# Patient Record
Sex: Male | Born: 1990 | Race: Black or African American | Hispanic: No | Marital: Single | Smoking: Never smoker
Health system: Southern US, Community
[De-identification: ages and names within clinical notes are randomized; demographics above are authoritative.]

## PROBLEM LIST (undated history)

## (undated) DIAGNOSIS — E669 Obesity, unspecified: Secondary | ICD-10-CM

## (undated) DIAGNOSIS — K2210 Ulcer of esophagus without bleeding: Secondary | ICD-10-CM

## (undated) DIAGNOSIS — J45909 Unspecified asthma, uncomplicated: Secondary | ICD-10-CM

## (undated) DIAGNOSIS — K219 Gastro-esophageal reflux disease without esophagitis: Secondary | ICD-10-CM

## (undated) HISTORY — DX: Obesity, unspecified: E66.9

## (undated) HISTORY — DX: Ulcer of esophagus without bleeding: K22.10

## (undated) HISTORY — PX: SHOULDER SURGERY: SHX246

---
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
## 2012-01-31 ENCOUNTER — Encounter (HOSPITAL_COMMUNITY): Payer: Self-pay

## 2012-01-31 ENCOUNTER — Emergency Department (HOSPITAL_COMMUNITY): Payer: Medicaid Other

## 2012-01-31 ENCOUNTER — Emergency Department (HOSPITAL_COMMUNITY)
Admission: EM | Admit: 2012-01-31 | Discharge: 2012-01-31 | Disposition: A | Discharge status: Home / Self Care | Payer: Medicaid Other | Attending: Emergency Medicine | Admitting: Emergency Medicine

## 2012-01-31 DIAGNOSIS — R112 Nausea with vomiting, unspecified: Secondary | ICD-10-CM | POA: Insufficient documentation

## 2012-01-31 DIAGNOSIS — R1013 Epigastric pain: Secondary | ICD-10-CM | POA: Insufficient documentation

## 2012-01-31 DIAGNOSIS — K219 Gastro-esophageal reflux disease without esophagitis: Secondary | ICD-10-CM | POA: Insufficient documentation

## 2012-01-31 DIAGNOSIS — J45909 Unspecified asthma, uncomplicated: Secondary | ICD-10-CM | POA: Insufficient documentation

## 2012-01-31 DIAGNOSIS — Z79899 Other long term (current) drug therapy: Secondary | ICD-10-CM | POA: Insufficient documentation

## 2012-01-31 HISTORY — DX: Gastro-esophageal reflux disease without esophagitis: K21.9

## 2012-01-31 HISTORY — DX: Unspecified asthma, uncomplicated: J45.909

## 2012-01-31 LAB — COMPREHENSIVE METABOLIC PANEL
ALT: 36 U/L (ref 0–53)
AST: 27 U/L (ref 0–37)
Albumin: 3.5 g/dL (ref 3.5–5.2)
Chloride: 100 mEq/L (ref 96–112)
Creatinine, Ser: 0.93 mg/dL (ref 0.50–1.35)
Sodium: 137 mEq/L (ref 135–145)
Total Bilirubin: 0.3 mg/dL (ref 0.3–1.2)

## 2012-01-31 LAB — CBC
Hemoglobin: 15.2 g/dL (ref 13.0–17.0)
MCH: 27.5 pg (ref 26.0–34.0)
MCHC: 33.8 g/dL (ref 30.0–36.0)
MCV: 81.4 fL (ref 78.0–100.0)
RBC: 5.53 MIL/uL (ref 4.22–5.81)
RDW: 14.1 % (ref 11.5–15.5)

## 2012-01-31 LAB — URINALYSIS, ROUTINE W REFLEX MICROSCOPIC
Ketones, ur: NEGATIVE mg/dL
Leukocytes, UA: NEGATIVE
Nitrite: NEGATIVE
Specific Gravity, Urine: 1.026 (ref 1.005–1.030)
pH: 6.5 (ref 5.0–8.0)

## 2012-01-31 LAB — URINE MICROSCOPIC-ADD ON

## 2012-01-31 LAB — LIPASE, BLOOD: Lipase: 28 U/L (ref 11–59)

## 2012-01-31 MED ORDER — OXYCODONE-ACETAMINOPHEN 5-325 MG PO TABS: ORAL_TABLET | ORAL | Status: DC

## 2012-01-31 MED ORDER — PANTOPRAZOLE SODIUM 20 MG PO TBEC: 40.0000 mg | DELAYED_RELEASE_TABLET | Freq: Every day | ORAL | Status: DC

## 2012-01-31 MED ORDER — PANTOPRAZOLE SODIUM 40 MG IV SOLR
40.0000 mg | Freq: Once | INTRAVENOUS | Status: DC
  Filled 2012-01-31: qty 40

## 2012-01-31 MED ORDER — ONDANSETRON HCL 4 MG/2ML IJ SOLN
4.0000 mg | Freq: Once | INTRAMUSCULAR | Status: DC
  Filled 2012-01-31: qty 2

## 2012-01-31 MED ORDER — ONDANSETRON HCL 4 MG PO TABS: 4.0000 mg | ORAL_TABLET | Freq: Three times a day (TID) | ORAL | Status: DC | PRN

## 2012-01-31 MED ORDER — MORPHINE SULFATE 4 MG/ML IJ SOLN
6.0000 mg | Freq: Once | INTRAMUSCULAR | Status: DC
  Filled 2012-01-31 (×2): qty 1

## 2012-01-31 MED ORDER — SODIUM CHLORIDE 0.9 % IV BOLUS (SEPSIS): 1000.0000 mL | Freq: Once | INTRAVENOUS | Status: DC

## 2012-01-31 MED ORDER — GI COCKTAIL ~~LOC~~
30.0000 mL | Freq: Once | ORAL | Status: AC
  Administered 2012-01-31: 30 mL via ORAL
  Filled 2012-01-31: qty 30

## 2012-01-31 MED ORDER — PANTOPRAZOLE SODIUM 40 MG PO TBEC
40.0000 mg | DELAYED_RELEASE_TABLET | Freq: Once | ORAL | Status: AC
  Administered 2012-01-31: 40 mg via ORAL
  Filled 2012-01-31: qty 1

## 2012-01-31 NOTE — ED Notes (Signed)
US at bedside

## 2012-01-31 NOTE — ED Provider Notes (Signed)
Medical screening examination/treatment/procedure(s) were performed by non-physician practitioner and as supervising physician I was immediately available for consultation/collaboration.  Doug Sou, MD 01/31/12 [phone]

## 2012-01-31 NOTE — ED Provider Notes (Signed)
History     CSN: [id number]  Arrival date & time 01/31/12  1122   First MD Initiated Contact with Patient 01/31/12 1257      Chief Complaint  Patient presents with  . Abdominal Pain    (Consider location/radiation/quality/duration/timing/severity/associated sxs/prior treatment) HPI  Kevin Bauer is a 21 y.o. male complaining of epigastric pain onset this a.m. at 5 AM. This pain woke him from sleep it was extreme: 10 out of 10. He had single episode of nonbloody nonbilious non-coffee-ground emesis today. Since that time he is eaten for chest sausage is without issue. Patient cannot identify any exacerbating factors. He denies pain with movement or palpation. He states the pain comes in waves and is unpredictable. Pain is not exacerbated by food intake. He states it is slightly relieved with emesis. Patient has had similar episodes sometimes weekly sometimes daily for several months now. He was diagnosed with GERD and Protonix. Patient has not had any Protonix in 3 weeks because he is ran out of his prescription.  Past Medical History  Diagnosis Date  . GERD (gastroesophageal reflux disease)   . Asthma     History reviewed. No pertinent past surgical history.  No family history on file.  History  Substance Use Topics  . Smoking status: Never Smoker   . Smokeless tobacco: Not on file  . Alcohol Use: No      Review of Systems  Constitutional: Negative for fever.  Respiratory: Negative for shortness of breath.   Cardiovascular: Negative for chest pain.  Gastrointestinal: Positive for nausea, vomiting and abdominal pain. Negative for diarrhea.  All other systems reviewed and are negative.    Allergies  Review of patient's allergies indicates no known allergies.  Home Medications   Current Outpatient Rx  Name  Route  Sig  Dispense  Refill  . BISMUTH SUBSALICYLATE 262 MG/15ML PO SUSP   Oral   Take 15 mLs by mouth every 6 (six) hours as needed.         Marland Kitchen OMEPRAZOLE  20 MG PO CPDR   Oral   Take 1 capsule (20 mg total) by mouth daily.   30 capsule   1     BP 132/75  Pulse 87  Temp 98.4 F (36.9 C) (Oral)  Resp 18  Ht 5\' 11"  (1.803 m)  Wt 275 lb (124.739 kg)  BMI 38.35 kg/m2  SpO2 100%  Physical Exam  Nursing note and vitals reviewed. Constitutional: He is oriented to person, place, and time. He appears well-developed and well-nourished. No distress.  HENT:  Head: Normocephalic.  Mouth/Throat: Oropharynx is clear and moist.  Eyes: Conjunctivae normal and EOM are normal. Pupils are equal, round, and reactive to light.  Cardiovascular: Normal rate, regular rhythm and intact distal pulses.   Pulmonary/Chest: Effort normal and breath sounds normal. No stridor. No respiratory distress. He has no wheezes. He has no rales. He exhibits no tenderness.  Abdominal: Soft. Bowel sounds are normal. He exhibits no distension and no mass. There is no tenderness. There is no rebound and no guarding.       No tenderness to palpation or peritoneal signs.  Musculoskeletal: Normal range of motion.  Neurological: He is alert and oriented to person, place, and time.  Psychiatric: He has a normal mood and affect.    ED Course  Procedures (including critical care time)  Labs Reviewed  URINALYSIS, ROUTINE W REFLEX MICROSCOPIC - Abnormal; Notable for the following:    Protein, ur 100 (*)  All other components within normal limits  URINE MICROSCOPIC-ADD ON - Abnormal; Notable for the following:    Bacteria, UA FEW (*)     All other components within normal limits  COMPREHENSIVE METABOLIC PANEL  CBC  LIPASE, BLOOD   US Abdomen Complete  01/31/2012  Abdominal ultrasound  History:  Abdominal pain  Findings:  Gallbladder is visualized in multiple projections. There are no gallstones, gallbladder wall thickening, or pericholecystic fluid collection.  There is no intrahepatic, common hepatic, common bile duct dilatation.  Pancreas is obscured by gas.  There is  fatty change in the liver.  No focal liver lesions are identified.  Spleen is normal in size and homogeneous in echotexture.  Flank kidneys appear normal bilaterally.  There is no ascites.  Aorta is nonaneurysmal.  Inferior vena cava appears normal.  Conclusion:  Fatty liver.  While no focal liver lesions are identified, it must be cautioned that the sensitivity of ultrasound for more subtle liver lesions is diminished given underlying fatty change.  Pancreas is obscured by gas.  Study is otherwise unremarkable.   Original Report Authenticated By: Bretta Bang, M.D.      1. Epigastric pain       MDM   Serial abdominal exams remained benign. Patient is tolerating by mouth at this time. Blood work and urinalysis are unremarkable. It is likely related to patient running out of his Protonix and he also reports she's been taking Motrin for pain control. I have advised him not to take any NSAIDs for pain control.  Abdominal ultrasound shows no acute abnormality.   Pt verbalized understanding and agrees with care plan. Outpatient follow-up and return precautions given.    New Prescriptions   ONDANSETRON (ZOFRAN) 4 MG TABLET    Take 1 tablet (4 mg total) by mouth every 8 (eight) hours as needed for nausea.   OXYCODONE-ACETAMINOPHEN (PERCOCET/ROXICET) 5-325 MG PER TABLET    1 to 2 tabs PO q6hrs  PRN for pain   PANTOPRAZOLE (PROTONIX) 20 MG TABLET    Take 2 tablets (40 mg total) by mouth daily.          Wynetta Emery, PA-C 01/31/12 1641

## 2012-01-31 NOTE — ED Notes (Signed)
Per [facility]- HX of the same- presents with NAD- Pt c/o of epigastric pain vomited this am- ate breakfast  Pain returned.  Pt ran out of prilosec 1 week ago

## 2012-03-21 HISTORY — PX: ESOPHAGOGASTRODUODENOSCOPY: SHX1529

## 2012-03-26 ENCOUNTER — Encounter: Payer: Self-pay | Admitting: Gastroenterology

## 2012-04-17 ENCOUNTER — Ambulatory Visit (INDEPENDENT_AMBULATORY_CARE_PROVIDER_SITE_OTHER): Payer: Medicaid Other | Admitting: Gastroenterology

## 2012-04-17 ENCOUNTER — Encounter: Payer: Self-pay | Admitting: Gastroenterology

## 2012-04-17 VITALS — BP 108/78 | HR 78 | Ht 71.0 in | Wt 275.8 lb

## 2012-04-17 DIAGNOSIS — K219 Gastro-esophageal reflux disease without esophagitis: Secondary | ICD-10-CM

## 2012-04-17 MED ORDER — DEXLANSOPRAZOLE 60 MG PO CPDR: 60.0000 mg | DELAYED_RELEASE_CAPSULE | Freq: Every day | ORAL | Status: DC

## 2012-04-17 NOTE — Progress Notes (Signed)
History of Present Illness: Pleasant 22 year old Afro-American male referred at the request of Dr. Bruna Potter for evaluation of chest burning. For the last 2-3 years she's been complaining of very frequent severe chest burning. Symptoms may occur anytime during the day or evening. It is sometimes  associated with nausea and vomiting which helps relieve the discomfort. He has taken protonix and omeprazole without relief. Pepto-Bismol gives him some temporary relief. He denies dysphagia. He is on no gastric irritants including nonsteroidals. He does not drink.    Past Medical History  Diagnosis Date  . GERD (gastroesophageal reflux disease)   . Asthma   . Obesity    Past Surgical History  Procedure Date  . Shoulder surgery     right   family history includes Hypertension in his maternal grandmother and mother. Current Outpatient Prescriptions  Medication Sig Dispense Refill  . bismuth subsalicylate (PEPTO BISMOL) 262 MG/15ML suspension Take 15 mLs by mouth every 6 (six) hours as needed.      Marland Kitchen omeprazole (PRILOSEC) 20 MG capsule Take 1 capsule (20 mg total) by mouth daily.  30 capsule  1  . ondansetron (ZOFRAN) 4 MG tablet Take 1 tablet (4 mg total) by mouth every 8 (eight) hours as needed for nausea.  10 tablet  0  . oxyCODONE-acetaminophen (PERCOCET/ROXICET) 5-325 MG per tablet 1 to 2 tabs PO q6hrs  PRN for pain  15 tablet  0  . pantoprazole (PROTONIX) 20 MG tablet Take 2 tablets (40 mg total) by mouth daily.  30 tablet  0   Allergies as of 04/17/2012  . (No Known Allergies)    reports that he has never smoked. He has never used smokeless tobacco. He reports that he does not drink alcohol or use illicit drugs.     Review of Systems: Pertinent positive and negative review of systems were noted in the above HPI section. All other review of systems were otherwise negative.  Vital signs were reviewed in today's medical record Physical Exam: General: Well developed , well nourished, no  acute distress Skin: anicteric Head: Normocephalic and atraumatic Eyes:  sclerae anicteric, EOMI Ears: Normal auditory acuity Mouth: No deformity or lesions Neck: Supple, no masses or thyromegaly Lungs: Clear throughout to auscultation Heart: Regular rate and rhythm; no murmurs, rubs or bruits Abdomen: Soft, non tender and non distended. No masses, hepatosplenomegaly or hernias noted. Normal Bowel sounds Rectal:deferred Musculoskeletal: Symmetrical with no gross deformities  Skin: No lesions on visible extremities Pulses:  Normal pulses noted Extremities: No clubbing, cyanosis, edema or deformities noted Neurological: Alert oriented x 4, grossly nonfocal Cervical Nodes:  No significant cervical adenopathy Inguinal Nodes: No significant inguinal adenopathy Psychological:  Alert and cooperative. Normal mood and affect

## 2012-04-17 NOTE — Patient Instructions (Addendum)
You have been scheduled for an endoscopy with propofol. Please follow written instructions given to you at your visit today. If you use inhalers (even only as needed) or a CPAP machine, please bring them with you on the day of your procedure. Dexilant samples given

## 2012-04-17 NOTE — Assessment & Plan Note (Signed)
Patient's symptoms are compatible with esophageal reflux refractory to medical therapy. Vomiting with pain relief raises the question of active peptic ulcer disease.  Recommendations #1 trial of dexilant 60 mg before breakfast and dinner #2 upper endoscopy

## 2012-05-03 ENCOUNTER — Encounter: Payer: Medicaid Other | Admitting: Gastroenterology

## 2012-05-04 ENCOUNTER — Encounter: Payer: Medicaid Other | Admitting: Gastroenterology

## 2012-05-18 ENCOUNTER — Ambulatory Visit (AMBULATORY_SURGERY_CENTER): Payer: Medicaid Other | Admitting: Gastroenterology

## 2012-05-18 ENCOUNTER — Encounter: Payer: Self-pay | Admitting: Gastroenterology

## 2012-05-18 VITALS — BP 145/86 | HR 77 | Temp 97.5°F | Resp 20 | Ht 71.0 in | Wt 275.0 lb

## 2012-05-18 DIAGNOSIS — K219 Gastro-esophageal reflux disease without esophagitis: Secondary | ICD-10-CM

## 2012-05-18 MED ORDER — SODIUM CHLORIDE 0.9 % IV SOLN: 500.0000 mL | INTRAVENOUS | Status: DC

## 2012-05-18 MED ORDER — DEXLANSOPRAZOLE 60 MG PO CPDR: 60.0000 mg | DELAYED_RELEASE_CAPSULE | Freq: Two times a day (BID) | ORAL | Status: AC

## 2012-05-18 NOTE — Patient Instructions (Addendum)
Gastroesophageal Reflux Disease, Adult Gastroesophageal reflux disease (GERD) happens when acid from your stomach flows up into the esophagus. When acid comes in contact with the esophagus, the acid causes soreness (inflammation) in the esophagus. Over time, GERD may create small holes (ulcers) in the lining of the esophagus. CAUSES   Increased body weight. This puts pressure on the stomach, making acid rise from the stomach into the esophagus.  Smoking. This increases acid production in the stomach.  Drinking alcohol. This causes decreased pressure in the lower esophageal sphincter (valve or ring of muscle between the esophagus and stomach), allowing acid from the stomach into the esophagus.  Late evening meals and a full stomach. This increases pressure and acid production in the stomach.  A malformed lower esophageal sphincter. Sometimes, no cause is found. SYMPTOMS   Burning pain in the lower part of the mid-chest behind the breastbone and in the mid-stomach area. This may occur twice a week or more often.  Trouble swallowing.  Sore throat.  Dry cough.  Asthma-like symptoms including chest tightness, shortness of breath, or wheezing. DIAGNOSIS  Your caregiver may be able to diagnose GERD based on your symptoms. In some cases, X-rays and other tests may be done to check for complications or to check the condition of your stomach and esophagus. TREATMENT  Your caregiver may recommend over-the-counter or prescription medicines to help decrease acid production. Ask your caregiver before starting or adding any new medicines.  HOME CARE INSTRUCTIONS   Change the factors that you can control. Ask your caregiver for guidance concerning weight loss, quitting smoking, and alcohol consumption.  Avoid foods and drinks that make your symptoms worse, such as:  Caffeine or alcoholic drinks.  Chocolate.  Peppermint or mint flavorings.  Garlic and onions.  Spicy foods.  Citrus fruits,  such as oranges, lemons, or limes.  Tomato-based foods such as sauce, chili, salsa, and pizza.  Fried and fatty foods.  Avoid lying down for the 3 hours prior to your bedtime or prior to taking a nap.  Eat small, frequent meals instead of large meals.  Wear loose-fitting clothing. Do not wear anything tight around your waist that causes pressure on your stomach.  Raise the head of your bed 6 to 8 inches with wood blocks to help you sleep. Extra pillows will not help.  Only take over-the-counter or prescription medicines for pain, discomfort, or fever as directed by your caregiver.  Do not take aspirin, ibuprofen, or other nonsteroidal anti-inflammatory drugs (NSAIDs). SEEK IMMEDIATE MEDICAL CARE IF:   You have pain in your arms, neck, jaw, teeth, or back.  Your pain increases or changes in intensity or duration.  You develop nausea, vomiting, or sweating (diaphoresis).  You develop shortness of breath, or you faint.  Your vomit is green, yellow, black, or looks like coffee grounds or blood.  Your stool is red, bloody, or black. These symptoms could be signs of other problems, such as heart disease, gastric bleeding, or esophageal bleeding. MAKE SURE YOU:   Understand these instructions.  Will watch your condition.  Will get help right away if you are not doing well or get worse. Document Released: 12/15/2004 Document Revised: 05/30/2011 Document Reviewed: 09/24/2010 [facility] Patient Information 2013 [place], [place].  Esophagitis Esophagitis is inflammation of the esophagus. It can involve swelling, soreness, and pain in the esophagus. This condition can make it difficult and painful to swallow. CAUSES  Most causes of esophagitis are not serious. Many different factors can cause esophagitis, including:  Gastroesophageal reflux disease (GERD). This is when acid from your stomach flows up into the esophagus.  Recurrent vomiting.  An allergic-type reaction.  Certain  medicines, especially those that come in large pills.  Ingestion of harmful chemicals, such as household cleaning products.  Heavy alcohol use.  An infection of the esophagus.  Radiation treatment for cancer.  Certain diseases such as sarcoidosis, Crohn's disease, and scleroderma. These diseases may cause recurrent esophagitis. SYMPTOMS   Trouble swallowing.  Painful swallowing.  Chest pain.  Difficulty breathing.  Nausea.  Vomiting.  Abdominal pain. DIAGNOSIS  Your caregiver will take your history and do a physical exam. Depending upon what your caregiver finds, certain tests may also be done, including:  Barium X-ray. You will drink a solution that coats the esophagus, and X-rays will be taken.  Endoscopy. A lighted tube is put down the esophagus so your caregiver can examine the area.  Allergy tests. These can sometimes be arranged through follow-up visits. TREATMENT  Treatment will depend on the cause of your esophagitis. In some cases, steroids or other medicines may be given to help relieve your symptoms or to treat the underlying cause of your condition. Medicines that may be recommended include:  Viscous lidocaine, to soothe the esophagus.  Antacids.  Acid reducers.  Proton pump inhibitors.  Antiviral medicines for certain viral infections of the esophagus.  Antifungal medicines for certain fungal infections of the esophagus.  Antibiotic medicines, depending on the cause of the esophagitis. HOME CARE INSTRUCTIONS   Avoid foods and drinks that seem to make your symptoms worse.  Eat small, frequent meals instead of large meals.  Avoid eating for the 3 hours prior to your bedtime.  If you have trouble taking pills, use a pill splitter to decrease the size and likelihood of the pill getting stuck or injuring the esophagus on the way down. Drinking water after taking a pill also helps.  Stop smoking if you smoke.  Maintain a healthy weight.  Wear  loose-fitting clothing. Do not wear anything tight around your waist that causes pressure on your stomach.  Raise the head of your bed 6 to 8 inches with wood blocks to help you sleep. Extra pillows will not help.  Only take over-the-counter or prescription medicines as directed by your caregiver. SEEK IMMEDIATE MEDICAL CARE IF:  You have severe chest pain that radiates into your arm, neck, or jaw.  You feel sweaty, dizzy, or lightheaded.  You have shortness of breath.  You vomit blood.  You have difficulty or pain with swallowing.  You have bloody or black, tarry stools.  You have a fever.  You have a burning sensation in the chest more than 3 times a week for more than 2 weeks.  You cannot swallow, drink, or eat.  You drool because you cannot swallow your saliva. MAKE SURE YOU:  Understand these instructions.  Will watch your condition.  Will get help right away if you are not doing well or get worse. Document Released: 04/14/2004 Document Revised: 05/30/2011 Document Reviewed: 11/05/2010 [employer] Patient Information 2013 [employer].   YOU HAD AN ENDOSCOPIC PROCEDURE TODAY AT THE [facility]: Refer to the procedure report that was given to you for any specific questions about what was found during the examination.  If the procedure report does not answer your questions, please call your gastroenterologist to clarify.  If you requested that your care partner not be given the details of your procedure findings, then the procedure report has  been included in a sealed envelope for you to review at your convenience later.  YOU SHOULD EXPECT: Some feelings of bloating in the abdomen. Passage of more gas than usual.  Walking can help get rid of the air that was put into your GI tract during the procedure and reduce the bloating. If you had a lower endoscopy (such as a colonoscopy or flexible sigmoidoscopy) you may notice spotting of blood in your stool or on the  toilet paper. If you underwent a bowel prep for your procedure, then you may not have a normal bowel movement for a few days.  DIET: Your first meal following the procedure should be a light meal and then it is ok to progress to your normal diet.  A half-sandwich or bowl of soup is an example of a good first meal.  Heavy or fried foods are harder to digest and may make you feel nauseous or bloated.  Likewise meals heavy in dairy and vegetables can cause extra gas to form and this can also increase the bloating.  Drink plenty of fluids but you should avoid alcoholic beverages for 24 hours.  ACTIVITY: Your care partner should take you home directly after the procedure.  You should plan to take it easy, moving slowly for the rest of the day.  You can resume normal activity the day after the procedure however you should NOT DRIVE or use heavy machinery for 24 hours (because of the sedation medicines used during the test).    SYMPTOMS TO REPORT IMMEDIATELY: A gastroenterologist can be reached at any hour.  During normal business hours, 8:30 AM to 5:00 PM Monday through Friday, call [phone].  After hours and on weekends, please call the GI answering service at [phone] who will take a message and have the physician on call contact you.     Following upper endoscopy (EGD)  Vomiting of blood or coffee ground material  New chest pain or pain under the shoulder blades  Painful or persistently difficult swallowing  New shortness of breath  Fever of 100F or higher  Black, tarry-looking stools  FOLLOW UP: If any biopsies were taken you will be contacted by phone or by letter within the next 1-3 weeks.  Call your gastroenterologist if you have not heard about the biopsies in 3 weeks.  Our staff will call the home number listed on your records the next business day following your procedure to check on you and address any questions or concerns that you may have at that time regarding the  information given to you following your procedure. This is a courtesy call and so if there is no answer at the home number and we have not heard from you through the emergency physician on call, we will assume that you have returned to your regular daily activities without incident.  SIGNATURES/CONFIDENTIALITY: You and/or your care partner have signed paperwork which will be entered into your electronic medical record.  These signatures attest to the fact that that the information above on your After Visit Summary has been reviewed and is understood.  Full responsibility of the confidentiality of this discharge information lies with you and/or your care-partner.   Information on esophagitis given to you today   dexilant 60 mg twice a day for two weeks then once daily sent to your pharmacy [facility]  [place] office visit with dr Arlyce Dice for 6-8 weeks from now

## 2012-05-18 NOTE — Op Note (Signed)
 [facility] [address] [place], [address]   ENDOSCOPY PROCEDURE REPORT  PATIENT: Kevin Bauer, Kevin Bauer  MR#: [id number] BIRTHDATE: December 26, 1990 , 22  yrs. old GENDER: Male ENDOSCOPIST: Louis Meckel, MD REFERRED BY:  Clyda Greener, M.D. PROCEDURE DATE:  05/18/2012 PROCEDURE:  EGD, diagnostic ASA CLASS:     Class I INDICATIONS:  Heartburn. MEDICATIONS: MAC sedation, administered by CRNA, propofol (Diprivan) 200mg  IV, and Romazicon TOPICAL ANESTHETIC:  DESCRIPTION OF PROCEDURE: After the risks benefits and alternatives of the procedure were thoroughly explained, informed consent was obtained.  [facility] [id number] endoscope was introduced through the mouth and advanced to the third portion of the duodenum. Without limitations.  The instrument was slowly withdrawn as the mucosa was fully examined.      At the GE junction there was grade B.  erosive esophagitis.   At the GE junction there was grade B.  erosive esophagitis.   The remainder of the upper endoscopy exam was otherwise normal. Retroflexed views revealed no abnormalities.     The scope was then withdrawn from the patient and the procedure completed.  COMPLICATIONS: There were no complications. ENDOSCOPIC IMPRESSION: 1.   erosive esophagitis  RECOMMENDATIONS: 1.  dexilant 60 mg twice a day for 2 weeks and then daily 2.  antireflux measures 3.  office visit 6 weeks 6 weeks REPEAT EXAM:  eSigned:  Louis Meckel, MD 05/18/2012 3:24 PM   CC:

## 2012-05-18 NOTE — Progress Notes (Signed)
Patient did not have preoperative order for IV antibiotic SSI prophylaxis. (G8918)  Patient did not experience any of the following events: a burn prior to discharge; a fall within the facility; wrong site/side/patient/procedure/implant event; or a hospital transfer or hospital admission upon discharge from the facility. (G8907)  

## 2012-05-18 NOTE — Progress Notes (Signed)
A/O x 3 pleased with MAC report to Penny RN 

## 2012-05-21 ENCOUNTER — Telehealth: Payer: Self-pay | Admitting: *Deleted

## 2012-05-21 NOTE — Telephone Encounter (Signed)
  Follow up Call-  Call back number 05/18/2012  Post procedure Call Back phone  # [phone]  Permission to leave phone message Yes     Patient questions:  Do you have a fever, pain , or abdominal swelling? no Pain Score  0 *  Have you tolerated food without any problems? yes  Have you been able to return to your normal activities? yes  Do you have any questions about your discharge instructions: Diet   no Medications  no Follow up visit  no  Do you have questions or concerns about your Care? no  Actions: * If pain score is 4 or above: No action needed, pain <4.

## 2012-07-02 ENCOUNTER — Ambulatory Visit: Payer: Medicaid Other | Admitting: Gastroenterology

## 2012-10-10 ENCOUNTER — Encounter: Payer: Self-pay | Admitting: Gastroenterology

## 2012-10-10 ENCOUNTER — Ambulatory Visit (INDEPENDENT_AMBULATORY_CARE_PROVIDER_SITE_OTHER): Payer: Medicaid Other | Admitting: Gastroenterology

## 2012-10-10 VITALS — BP 120/74 | HR 64 | Ht 71.0 in | Wt 272.4 lb

## 2012-10-10 DIAGNOSIS — K219 Gastro-esophageal reflux disease without esophagitis: Secondary | ICD-10-CM

## 2012-10-10 MED ORDER — OMEPRAZOLE-SODIUM BICARBONATE 40-1100 MG PO CAPS: ORAL_CAPSULE | ORAL | Status: DC

## 2012-10-10 NOTE — Progress Notes (Signed)
History of Present Illness:  The patient has returned for reevaluation of chest discomfort and pyrosis. Endoscopy demonstrated a grade A erosive esophagitis. Initially dexilant twice a day markedly improved symptoms. He is now having recurrent symptoms of pyrosis throughout the day and night despite medications. He has regurgitation of gastric contents within acid sensation. He denies coughing, sore throat or hoarseness.    Review of Systems: Pertinent positive and negative review of systems were noted in the above HPI section. All other review of systems were otherwise negative.    Current Medications, Allergies, Past Medical History, Past Surgical History, Family History and Social History were reviewed in [facility] electronic medical record  Vital signs were reviewed in today's medical record. Physical Exam: General: Well developed , well nourished, no acute distress On abdominal exam there are no abdominal masses, tenderness organomegaly. There is no succussion splash

## 2012-10-10 NOTE — Assessment & Plan Note (Signed)
He should has recurrent symptoms despite dexilant twice a day.  Recommendations #1 DC dexilant; trial of Zegerid 40 mg before breakfast and dinner #2 gastric emptying scan #3 antireflux measures #4 to consider 48 hour bravo pH study while on medications if symptoms continue unabated

## 2012-10-10 NOTE — Patient Instructions (Addendum)
You have been scheduled for a gastric emptying scan at [facility] on 10/17/2012 at 7am. Please arrive at least 15 minutes prior to your appointment for registration. Please make certain not to have anything to eat or drink after midnight the night before your test. Hold all stomach medications (ex: Zofran, phenergan, Reglan) 48 hours prior to your test. If you need to reschedule your appointment, please contact radiology scheduling at [phone]. _____________________________________________________________________ A gastric-emptying study measures how long it takes for food to move through your stomach. There are several ways to measure stomach emptying. In the most common test, you eat food that contains a small amount of radioactive material. A scanner that detects the movement of the radioactive material is placed over your abdomen to monitor the rate at which food leaves your stomach. This test normally takes about 2 hours to complete. _____________________________________________________________________

## 2012-10-17 ENCOUNTER — Encounter (HOSPITAL_COMMUNITY): Payer: Medicaid Other

## 2012-10-31 ENCOUNTER — Telehealth: Payer: Self-pay | Admitting: Gastroenterology

## 2012-10-31 DIAGNOSIS — K219 Gastro-esophageal reflux disease without esophagitis: Secondary | ICD-10-CM

## 2012-11-01 MED ORDER — OMEPRAZOLE-SODIUM BICARBONATE 40-1100 MG PO CAPS: ORAL_CAPSULE | ORAL | Status: DC

## 2012-11-01 NOTE — Telephone Encounter (Signed)
Offered pt samples till authorization is done

## 2012-11-05 ENCOUNTER — Encounter (HOSPITAL_COMMUNITY): Payer: Medicaid Other

## 2012-11-06 NOTE — Telephone Encounter (Signed)
Per pharmacy contact [phone] Medicaid of [facility]  Never faxed [place] a form

## 2012-11-06 NOTE — Telephone Encounter (Signed)
Pharmacy faxing form now

## 2012-11-06 NOTE — Telephone Encounter (Signed)
Medication approved through [facility] [id number] auth #  Notified mother

## 2012-11-08 ENCOUNTER — Encounter (HOSPITAL_COMMUNITY)
Admission: RE | Admit: 2012-11-08 | Discharge: 2012-11-08 | Disposition: A | Discharge status: Home / Self Care | Payer: Medicaid Other | Source: Ambulatory Visit | Attending: Gastroenterology | Admitting: Gastroenterology

## 2012-11-08 DIAGNOSIS — R6881 Early satiety: Secondary | ICD-10-CM | POA: Insufficient documentation

## 2012-11-08 DIAGNOSIS — K219 Gastro-esophageal reflux disease without esophagitis: Secondary | ICD-10-CM | POA: Insufficient documentation

## 2012-11-08 MED ORDER — TECHNETIUM TC 99M SULFUR COLLOID
1.9000 | Freq: Once | INTRAVENOUS | Status: AC | PRN
  Administered 2012-11-08: 1.9 via INTRAVENOUS

## 2012-11-15 ENCOUNTER — Telehealth: Payer: Self-pay | Admitting: Gastroenterology

## 2012-11-15 NOTE — Telephone Encounter (Signed)
Spoke with pts mother regarding GES results. Pt cannot come for OV due to leaving for school.   Pts mother would like to speak to Zella Ball regarding the pts medication. States she has been told it needs "third party verification." Message sent to [facility].

## 2012-11-15 NOTE — Telephone Encounter (Signed)
Called and informed pt that medication has been approved. Gave patients mother the approval number.   Explained to her that if they dont give it to him to contact [facility] here since im off work to get samples of Zegerid

## 2012-11-29 ENCOUNTER — Ambulatory Visit: Payer: Medicaid Other | Admitting: Gastroenterology

## 2013-05-18 ENCOUNTER — Encounter (HOSPITAL_COMMUNITY): Payer: Self-pay | Admitting: Emergency Medicine

## 2013-05-18 ENCOUNTER — Emergency Department (HOSPITAL_COMMUNITY)
Admission: EM | Admit: 2013-05-18 | Discharge: 2013-05-18 | Disposition: A | Discharge status: Home / Self Care | Payer: Medicaid Other | Attending: Emergency Medicine | Admitting: Emergency Medicine

## 2013-05-18 DIAGNOSIS — Y929 Unspecified place or not applicable: Secondary | ICD-10-CM | POA: Insufficient documentation

## 2013-05-18 DIAGNOSIS — E669 Obesity, unspecified: Secondary | ICD-10-CM | POA: Insufficient documentation

## 2013-05-18 DIAGNOSIS — Z8719 Personal history of other diseases of the digestive system: Secondary | ICD-10-CM | POA: Insufficient documentation

## 2013-05-18 DIAGNOSIS — W2209XA Striking against other stationary object, initial encounter: Secondary | ICD-10-CM | POA: Insufficient documentation

## 2013-05-18 DIAGNOSIS — S058X9A Other injuries of unspecified eye and orbit, initial encounter: Secondary | ICD-10-CM | POA: Insufficient documentation

## 2013-05-18 DIAGNOSIS — Y9389 Activity, other specified: Secondary | ICD-10-CM | POA: Insufficient documentation

## 2013-05-18 DIAGNOSIS — J45909 Unspecified asthma, uncomplicated: Secondary | ICD-10-CM | POA: Insufficient documentation

## 2013-05-18 DIAGNOSIS — S0500XA Injury of conjunctiva and corneal abrasion without foreign body, unspecified eye, initial encounter: Secondary | ICD-10-CM

## 2013-05-18 MED ORDER — ERYTHROMYCIN 5 MG/GM OP OINT: 1.0000 "application " | TOPICAL_OINTMENT | Freq: Four times a day (QID) | OPHTHALMIC | Status: DC

## 2013-05-18 MED ORDER — FLUORESCEIN SODIUM 1 MG OP STRP
1.0000 | ORAL_STRIP | Freq: Once | OPHTHALMIC | Status: AC
  Administered 2013-05-18: 1 via OPHTHALMIC
  Filled 2013-05-18: qty 1

## 2013-05-18 MED ORDER — TETRACAINE HCL 0.5 % OP SOLN
1.0000 [drp] | Freq: Once | OPHTHALMIC | Status: AC
  Administered 2013-05-18: 1 [drp] via OPHTHALMIC
  Filled 2013-05-18: qty 2

## 2013-05-18 NOTE — ED Notes (Addendum)
Pt reports that two days ago he was in a snow ball fight. Pt reports being hit in the left eye at that time. Pt has redness noted to the sclera of the left eye. Pt is A/O x4, in NAD, and vitals are WDL.

## 2013-05-18 NOTE — Discharge Instructions (Signed)
Corneal Abrasion  The cornea is the clear covering at the front and center of the eye. When looking at the colored portion of the eye (iris), you are looking through the cornea. This very thin tissue is made up of many layers. The surface layer is a single layer of cells (corneal epithelium) and is one of the most sensitive tissues in the body. If a scratch or injury causes the corneal epithelium to come off, it is called a corneal abrasion. If the injury extends to the tissues below the epithelium, the condition is called a corneal ulcer.  CAUSES    Scratches.   Trauma.   Foreign body in the eye.  Some people have recurrences of abrasions in the area of the original injury even after it has healed (recurrent erosion syndrome). Recurrent erosion syndrome generally improves and goes away with time.  SYMPTOMS    Eye pain.   Difficulty or inability to keep the injured eye open.   The eye becomes very sensitive to light.   Recurrent erosions tend to happen suddenly, first thing in the morning, usually after waking up and opening the eye.  DIAGNOSIS   Your health care provider can diagnose a corneal abrasion during an eye exam. Dye is usually placed in the eye using a drop or a small paper strip moistened by your tears. When the eye is examined with a special light, the abrasion shows up clearly because of the dye.  TREATMENT    Small abrasions may be treated with antibiotic drops or ointment alone.   Usually a pressure patch is specially applied. Pressure patches prevent the eye from blinking, allowing the corneal epithelium to heal. A pressure patch also reduces the amount of pain present in the eye during healing. Most corneal abrasions heal within 2 3 days with no effect on vision.  If the abrasion becomes infected and spreads to the deeper tissues of the cornea, a corneal ulcer can result. This is serious because it can cause corneal scarring. Corneal scars interfere with light passing through the cornea  and cause a loss of vision in the involved eye.  HOME CARE INSTRUCTIONS   Use medicine or ointment as directed. Only take over-the-counter or prescription medicines for pain, discomfort, or fever as directed by your health care provider.   Do not drive or operate machinery while your eye is patched. Your ability to judge distances is impaired.   If your health care provider has given you a follow-up appointment, it is very important to keep that appointment. Not keeping the appointment could result in a severe eye infection or permanent loss of vision. If there is any problem keeping the appointment, let your health care provider know.  SEEK MEDICAL CARE IF:    You have pain, light sensitivity, and a scratchy feeling in one eye or both eyes.   Your pressure patch keeps loosening up, and you can blink your eye under the patch after treatment.   Any kind of discharge develops from the eye after treatment or if the lids stick together in the morning.   You have the same symptoms in the morning as you did with the original abrasion days, weeks, or months after the abrasion healed.  MAKE SURE YOU:    Understand these instructions.   Will watch your condition.   Will get help right away if you are not doing well or get worse.  Document Released: 03/04/2000 Document Revised: 12/26/2012 Document Reviewed: 11/12/2012  [employer] Patient Information   2014 [employer].

## 2013-05-18 NOTE — ED Provider Notes (Signed)
CSN: [id number]     Arrival date & time 05/18/13  1143 History  This chart was scribed for non-physician practitioner, Arthor CaptainAbigail Yeraldi Fidler, PA-C,working with Audree CamelScott T Goldston, MD, by Karle PlumberJennifer Tensley, ED Scribe.  This patient was seen in room WTR9/WTR9 and the patient's care was started at 12:49 PM.  Chief Complaint  Patient presents with  . Eye Injury   The history is provided by the patient. No language interpreter was used.   HPI Comments:  Kevin Bauer is a 23 y.o. male who presents to the Emergency Department complaining of a left eye injury secondary to a snowball hitting him directly in his eye two days ago. Pt reports associated erythema, tearing, sensitivity to light, swelling and pain. Pt denies visual changes, pain behind his eye, or right eye pain. Pt denies family h/o glaucoma.    Past Medical History  Diagnosis Date  . GERD (gastroesophageal reflux disease)   . Asthma   . Obesity    Past Surgical History  Procedure Laterality Date  . Shoulder surgery      right   Family History  Problem Relation Age of Onset  . Hypertension Mother   . Hypertension Maternal Grandmother    History  Substance Use Topics  . Smoking status: Never Smoker   . Smokeless tobacco: Never Used  . Alcohol Use: No    Review of Systems  Eyes: Positive for photophobia, pain, discharge and redness. Negative for itching and visual disturbance.  All other systems reviewed and are negative.   Allergies  Review of patient's allergies indicates no known allergies.  Home Medications  No current outpatient prescriptions on file. Triage Vitals: BP 142/72  Pulse 84  Temp(Src) 98.2 F (36.8 C) (Oral)  Resp 20  SpO2 100% Physical Exam  Nursing note and vitals reviewed. Constitutional: He is oriented to person, place, and time. He appears well-developed and well-nourished.  HENT:  Head: Normocephalic and atraumatic.  Eyes: EOM are normal. Pupils are equal, round, and reactive to light. Right eye  exhibits no chemosis, no discharge, no exudate and no hordeolum. No foreign body present in the right eye. Left eye exhibits discharge. Left eye exhibits no chemosis, no exudate and no hordeolum. No foreign body present in the left eye. Right conjunctiva is not injected. Right conjunctiva has no hemorrhage. Left conjunctiva is injected. Left conjunctiva has no hemorrhage. Right eye exhibits normal extraocular motion and no nystagmus. Left eye exhibits normal extraocular motion and no nystagmus.  No pain with EOM. Vision intact bilaterally.   Neck: Normal range of motion.  Cardiovascular: Normal rate.   Pulmonary/Chest: Effort normal.  Musculoskeletal: Normal range of motion.  Neurological: He is alert and oriented to person, place, and time.  Skin: Skin is warm and dry.  Psychiatric: He has a normal mood and affect. His behavior is normal.    ED Course  Procedures (including critical care time) DIAGNOSTIC STUDIES: Oxygen Saturation is 100% on RA, normal by my interpretation.   COORDINATION OF CARE: 12:59 PM- Instilled tetracaine drops and stained eye with fluorescein strip to identify corneal abrasion. Will prescribe antibiotic ointment and refer to opthalmology for worsened or continuing symptoms. Pt verbalizes understanding and agrees to plan.  Medications  tetracaine (PONTOCAINE) 0.5 % ophthalmic solution 1 drop (not administered)  fluorescein ophthalmic strip 1 strip (not administered)    Labs Review Labs Reviewed - No data to display Imaging Review No results found.   EKG Interpretation None      MDM  Final diagnoses:  None    Corneal abrasion  Pt with corneal abrasion on PE. Tdap given. Eye irrigated w NS, no evidence of FB.  No change in vision, acuity equal bilaterally.  Pt is not a contact lens wearer.  Exam non-concerning for orbital cellulitis, hyphema, corneal ulcers. Patient will be discharged home with erythromycin.   Patient understands to follow up with  ophthalmology, & to return to ER if new symptoms develop including change in vision, purulent drainage, or entrapment.    I personally performed the services described in this documentation, which was scribed in my presence. The recorded information has been reviewed and is accurate.    Arthor Captain, PA-C 05/20/13 [phone]

## 2013-05-21 NOTE — ED Provider Notes (Signed)
Medical screening examination/treatment/procedure(s) were performed by non-physician practitioner and as supervising physician I was immediately available for consultation/collaboration.   EKG Interpretation None        Audree CamelScott T Deneice Wack, MD 05/21/13 [phone]

## 2013-06-28 ENCOUNTER — Telehealth: Payer: Self-pay | Admitting: Gastroenterology

## 2013-06-28 NOTE — Telephone Encounter (Signed)
Pt still having problems with GERD. Requesting appt for pt when he is home from college. He returns on 08/08/13. Pt scheduled to see Dr. Arlyce DiceKaplan 08/16/13@9 :45am. Pts mother aware of appt.

## 2013-08-16 ENCOUNTER — Ambulatory Visit: Payer: Medicaid Other | Admitting: Gastroenterology

## 2013-10-07 ENCOUNTER — Telehealth: Payer: Self-pay | Admitting: Gastroenterology

## 2013-10-07 NOTE — Telephone Encounter (Signed)
Pt request to be seen sooner than scheduled appt. Pt scheduled to see Doug SouJessica Zehr PA 10/18/13@10 :30am. Pt aware of appt. Pt c/o epigastric pain and burning.

## 2013-10-14 ENCOUNTER — Encounter: Payer: Self-pay | Admitting: *Deleted

## 2013-10-18 ENCOUNTER — Encounter: Payer: Self-pay | Admitting: Gastroenterology

## 2013-10-18 ENCOUNTER — Ambulatory Visit (INDEPENDENT_AMBULATORY_CARE_PROVIDER_SITE_OTHER): Payer: Medicaid Other | Admitting: Gastroenterology

## 2013-10-18 ENCOUNTER — Telehealth: Payer: Self-pay | Admitting: *Deleted

## 2013-10-18 VITALS — BP 138/80 | HR 92 | Ht 71.0 in | Wt 295.0 lb

## 2013-10-18 DIAGNOSIS — K219 Gastro-esophageal reflux disease without esophagitis: Secondary | ICD-10-CM

## 2013-10-18 DIAGNOSIS — K2100 Gastro-esophageal reflux disease with esophagitis, without bleeding: Secondary | ICD-10-CM

## 2013-10-18 MED ORDER — SUCRALFATE 1 GM/10ML PO SUSP: 1.0000 g | Freq: Four times a day (QID) | ORAL | Status: DC

## 2013-10-18 MED ORDER — ESOMEPRAZOLE MAGNESIUM 40 MG PO CPDR: 40.0000 mg | DELAYED_RELEASE_CAPSULE | Freq: Two times a day (BID) | ORAL | Status: DC

## 2013-10-18 NOTE — Patient Instructions (Addendum)
You have been scheduled for an endoscopy/ph bravo with Dr. [facility]Kaplan at [facility]. Please follow written instructions given to you at your visit today. If you use inhalers (even only as needed), please bring them with you on the day of your procedure. Your physician has requested that you go to www.startemmi.com and enter the access code given to you at your visit today. This web site gives a general overview about your procedure. However, you should still follow specific instructions given to you by our office regarding your preparation for the procedure.  We have sent the following medications to your pharmacy for you to pick up at your convenience: Nexium 40 mg, please take one capsule by mouth thirty minutes before breakfast and thirty minutes before dinner Carafate, can use four times daily as needed

## 2013-10-18 NOTE — Progress Notes (Signed)
Reviewed and agree with management. Eisen Robenson D. Denai Caba, M.D., FACG  

## 2013-10-18 NOTE — Progress Notes (Signed)
     10/18/2013 Kevin Bauer [id number] 06/21/1990   History of Present Illness:  This is a 23 year old male who is known to Dr. Arlyce DiceKaplan for complaints of GERD.  EGD in 04/2012 showed erosive esophagitis.  He has been on several GERD medications including Dexilant 60 mg twice daily and zegerid BID with minimal or only transient relief.  He was last seen one year ago.  GES was negative for gastroparesis.  He presents to our office again today with his mom with ongoing complaints of recurrent pyrosis throughout the day and especially at night.  He has burning pain in his lower chest.  Cannot sleep at night due to symptoms.  Is currently only taking pepcid, which does not help.  He has gained approximately 23 pounds in the past year.   Current Medications, Allergies, Past Medical History, Past Surgical History, Family History and Social History were reviewed in [facility] electronic medical record.   Physical Exam: BP 138/80  Pulse 92  Ht 5\' 11"  (1.803 m)  Wt 295 lb (133.811 kg)  BMI 41.16 kg/m2 General:  Obese black male in no acute distress Head: Normocephalic and atraumatic Eyes:  Sclerae anicteric, conjunctiva pink  Ears: Normal auditory acuity Lungs: Clear throughout to auscultation Heart: Regular rate and rhythm Abdomen: Soft, non-distended.  Normal bowel sounds.  Non-tender. Musculoskeletal: Symmetrical with no gross deformities  Extremities: No edema  Neurological: Alert oriented x 4, grossly non-focal Psychological:  Alert and cooperative. Normal mood and affect  Assessment and Recommendations: -Esophageal reflux/GERD with esophagitis:  Failed several PPI's.  GES negative.  Will schedule 48 hour Bravo pH study while on PPI.  Needs to begin Nexium 40 mg BID 30 minutes before breakfast and 30 minutes before dinner.  Will add Carafate suspension 4 times daily as needed for now as well to see if this gives him symptomatic relief.  Discussed need for weight loss.  ? If he can't lose  weight on his own then may need evaluated by weight loss facility and possibly for bariatric surgery.

## 2013-10-18 NOTE — Telephone Encounter (Signed)
[facility] PRIOR AUTH NEEDED FOR PATIENTS NEXIUM CALLED MEDICAID AT [id number] TO START PRIOR AUTH FOR NEXIUM  I SPOKE WITH JESSICA AND PER JESSICA PATIENT WAS APPROVED APPROVAL NUMBER: [id number] FORM WILL BE FAXED TO OUR OFFICE PHARMACY WAS NOTIFIED

## 2013-11-15 ENCOUNTER — Telehealth: Payer: Self-pay | Admitting: Gastroenterology

## 2013-11-15 NOTE — Telephone Encounter (Signed)
Called patient back. Patients mother wanted to verify times of procedure at [facility] on Monday morning   Answered all questions

## 2013-11-18 ENCOUNTER — Encounter (HOSPITAL_COMMUNITY)
Admission: RE | Disposition: A | Discharge status: Home / Self Care | Payer: Self-pay | Source: Ambulatory Visit | Attending: Gastroenterology

## 2013-11-18 ENCOUNTER — Ambulatory Visit (HOSPITAL_COMMUNITY)
Admission: RE | Admit: 2013-11-18 | Discharge: 2013-11-18 | Disposition: A | Discharge status: Home / Self Care | Payer: Medicaid Other | Source: Ambulatory Visit | Attending: Gastroenterology | Admitting: Gastroenterology

## 2013-11-18 ENCOUNTER — Encounter (HOSPITAL_COMMUNITY): Payer: Self-pay | Admitting: *Deleted

## 2013-11-18 DIAGNOSIS — R12 Heartburn: Secondary | ICD-10-CM | POA: Diagnosis not present

## 2013-11-18 DIAGNOSIS — K208 Other esophagitis without bleeding: Secondary | ICD-10-CM | POA: Insufficient documentation

## 2013-11-18 DIAGNOSIS — K219 Gastro-esophageal reflux disease without esophagitis: Secondary | ICD-10-CM

## 2013-11-18 HISTORY — PX: ESOPHAGOGASTRODUODENOSCOPY: SHX5428

## 2013-11-18 HISTORY — PX: BRAVO PH STUDY: SHX5421

## 2013-11-18 SURGERY — PH MONITORING, ESOPHAGUS, WIRELESS
Anesthesia: Moderate Sedation

## 2013-11-18 MED ORDER — FENTANYL CITRATE 0.05 MG/ML IJ SOLN
INTRAMUSCULAR | Status: AC
  Filled 2013-11-18: qty 2

## 2013-11-18 MED ORDER — DIPHENHYDRAMINE HCL 50 MG/ML IJ SOLN
INTRAMUSCULAR | Status: DC | PRN
  Administered 2013-11-18: 25 mg via INTRAVENOUS

## 2013-11-18 MED ORDER — DIPHENHYDRAMINE HCL 50 MG/ML IJ SOLN
INTRAMUSCULAR | Status: AC
  Filled 2013-11-18: qty 1

## 2013-11-18 MED ORDER — MIDAZOLAM HCL 10 MG/2ML IJ SOLN
INTRAMUSCULAR | Status: DC | PRN
  Administered 2013-11-18 (×3): 2 mg via INTRAVENOUS

## 2013-11-18 MED ORDER — FENTANYL CITRATE 0.05 MG/ML IJ SOLN
INTRAMUSCULAR | Status: DC | PRN
  Administered 2013-11-18: 25 ug via INTRAVENOUS

## 2013-11-18 MED ORDER — BUTAMBEN-TETRACAINE-BENZOCAINE 2-2-14 % EX AERO
INHALATION_SPRAY | CUTANEOUS | Status: DC | PRN
  Administered 2013-11-18: 2 via TOPICAL

## 2013-11-18 MED ORDER — SODIUM CHLORIDE 0.9 % IV SOLN: INTRAVENOUS | Status: DC

## 2013-11-18 MED ORDER — MIDAZOLAM HCL 10 MG/2ML IJ SOLN
INTRAMUSCULAR | Status: AC
  Filled 2013-11-18: qty 2

## 2013-11-18 NOTE — Discharge Instructions (Addendum)
Conscious Sedation °Sedation is the use of medicines to promote relaxation and relieve discomfort and anxiety. Conscious sedation is a type of sedation. Under conscious sedation you are less alert than normal but are still able to respond to instructions or stimulation. Conscious sedation is used during short medical and dental procedures. It is milder than deep sedation or general anesthesia and allows you to return to your regular activities sooner.  °LET YOUR HEALTH CARE PROVIDER KNOW ABOUT:  °· Any allergies you have. °· All medicines you are taking, including vitamins, herbs, eye drops, creams, and over-the-counter medicines. °· Use of steroids (by mouth or creams). °· Previous problems you or members of your family have had with the use of anesthetics. °· Any blood disorders you have. °· Previous surgeries you have had. °· Medical conditions you have. °· Possibility of pregnancy, if this applies. °· Use of cigarettes, alcohol, or illegal drugs. °RISKS AND COMPLICATIONS °Generally, this is a safe procedure. However, as with any procedure, problems can occur. Possible problems include: °· Oversedation. °· Trouble breathing on your own. You may need to have a breathing tube until you are awake and breathing on your own. °· Allergic reaction to any of the medicines used for the procedure. °BEFORE THE PROCEDURE °· You may have blood tests done. These tests can help show how well your kidneys and liver are working. They can also show how well your blood clots. °· A physical exam will be done.   °· Only take medicines as directed by your health care provider. You may need to stop taking medicines (such as blood thinners, aspirin, or nonsteroidal anti-inflammatory drugs) before the procedure.   °· Do not eat or drink at least 6 hours before the procedure or as directed by your health care provider. °· Arrange for a responsible adult, family member, or friend to take you home after the procedure. He or she should stay  with you for at least 24 hours after the procedure, until the medicine has worn off. °PROCEDURE  °· An intravenous (IV) catheter will be inserted into one of your veins. Medicine will be able to flow directly into your body through this catheter. You may be given medicine through this tube to help prevent pain and help you relax. °· The medical or dental procedure will be done. °AFTER THE PROCEDURE °· You will stay in a recovery area until the medicine has worn off. Your blood pressure and pulse will be checked.   °·  Depending on the procedure you had, you may be allowed to go home when you can tolerate liquids and your pain is under control. °Document Released: 11/30/2000 Document Revised: 03/12/2013 Document Reviewed: 11/12/2012 °ExitCare® Patient Information ©2015 [employer]. This information is not intended to replace advice given to you by your health care provider. Make sure you discuss any questions you have with your health care provider. ° °

## 2013-11-18 NOTE — Progress Notes (Signed)
The first bravo was placed and as we were about to leave the procedure room I noticed that the ph was 4.6. I observed the ph level for 10 minuets and it stayed between 4.6 and 4.8. I spoke with Dr Arlyce Dice and expressed my concern that the capsule was in the patients stomach. Dr Arlyce Dice took a look with the EGD scope and confirmed that the capsule had detached and was in the stomach. We set up for a second capsule and delivered it without difficulty. Ph reading of 6.8 was noted and observed for seral minutes before moving the patient to recovery.

## 2013-11-18 NOTE — H&P (Signed)
  History of Present Illness: This is a 23 year old male who is known to Dr. Arlyce Dice for complaints of GERD. EGD in 04/2012 showed erosive esophagitis. He has been on several GERD medications including Dexilant 60 mg twice daily and zegerid BID with minimal or only transient relief. He was last seen one year ago. GES was negative for gastroparesis. He presents to our office again today with his mom with ongoing complaints of recurrent pyrosis throughout the day and especially at night. He has burning pain in his lower chest. Cannot sleep at night due to symptoms. Is currently only taking pepcid, which does not help. He has gained approximately 23 pounds in the past year.  Current Medications, Allergies, Past Medical History, Past Surgical History, Family History and Social History were reviewed in [employer] record.  Physical Exam:  BP 138/80  Pulse 92  Ht  (1.803 m)  Wt 295 lb (133.811 kg)  BMI 41.16 kg/m2  General: Obese black male in no acute distress  Head: Normocephalic and atraumatic  Eyes: Sclerae anicteric, conjunctiva pink  Ears: Normal auditory acuity  Lungs: Clear throughout to auscultation  Heart: Regular rate and rhythm  Abdomen: Soft, non-distended. Normal bowel sounds. Non-tender.  Musculoskeletal: Symmetrical with no gross deformities  Extremities: No edema  Neurological: Alert oriented x 4, grossly non-focal  Psychological: Alert and cooperative. Normal mood and affect  Assessment and Recommendations:  -Esophageal reflux/GERD with esophagitis: Failed several PPI's. GES negative. Will schedule 48 hour Bravo pH study while on PPI. Needs to begin Nexium 40 mg BID 30 minutes before breakfast and 30 minutes before dinner. Will add Carafate suspension 4 times daily as needed for now as well to see if this gives him symptomatic relief. Discussed need for weight loss. ? If he can't lose weight on his own then may need evaluated by weight loss facility and  possibly for bariatric surgery.

## 2013-11-18 NOTE — Op Note (Addendum)
[facility] [address] [place], [address]   ENDOSCOPY PROCEDURE REPORT  PATIENT: Kevin, Bauer  MR#: [id number] BIRTHDATE: Jun 23, 1990 , 23  yrs. old GENDER: Male ENDOSCOPIST: Louis Meckel, MD REFERRED BY: PROCEDURE DATE:  11/18/2013 PROCEDURE:  EGD w/ Bravo capsule placement ASA CLASS:     Class II INDICATIONS:  Heartburn. MEDICATIONS: These medications were titrated to patient response per physician's verbal order, Versed 6 mg IV, Fentanyl 25 mcg IV, and Benadryl 25 mg IV TOPICAL ANESTHETIC: Cetacaine Spray  DESCRIPTION OF PROCEDURE: After the risks benefits and alternatives of the procedure were thoroughly explained, informed consent was obtained.  The Pentax Gastroscope [id number] endoscope was introduced through the mouth and advanced to the third portion of the duodenum. Without limitations.  The instrument was slowly withdrawn as the mucosa was fully examined.      A large amount of retained food was seen in the stomach. There were a few erosions at the GE junction.  Z line was at 40 cm from the incisors.   A large amount of retained food was seen in the stomach. There were a few erosions at the GE junction.  Z line was at 40 cm from the incisors.   The remainder of the upper endoscopy exam was otherwise normal.  Retroflexed views revealed no abnormalities. The scope was then withdrawn from the patient and the procedure completed.  A bravo pH probe was placed at 34 cm from the incisors.  COMPLICATIONS: There were no complications. ENDOSCOPIC IMPRESSION: retained gastric contents erosive esophagitis  RECOMMENDATIONS: Continue PPI therapy during bravo pH study REPEAT EXAM:  eSigned:  Louis Meckel, MD 11/18/2013 10:38 AM Revised: 11/18/2013 10:38 AM  CC:

## 2013-11-19 ENCOUNTER — Encounter (HOSPITAL_COMMUNITY): Payer: Self-pay | Admitting: Gastroenterology

## 2013-12-02 NOTE — Op Note (Signed)
Kevin Bauer, Kevin Bauer                 ACCOUNT NO.:  [id number]  MEDICAL RECORD NO.:  [id number]  LOCATION:                                 FACILITY:  PHYSICIAN:  Barbette Hair. Arlyce Dice, MD,FACGDATE OF BIRTH:  1990/12/15  DATE OF PROCEDURE:  11/18/2013 DATE OF DISCHARGE:                              OPERATIVE REPORT   A 48-hour BRAVO pH study was performed while the patient was on medications.  On day 1, there were 102 episodes of reflux.  Total time spent in reflux was 1 hour 56 minutes.  DeMeester score was 29.2.  On day 2, total time in reflux was 4 hours and 57 minutes.  Percentage of time spent in reflux was 31.3%.  DeMeester score was 88.  IMPRESSION:  A 48-hour BRAVO pH study shows significant acid reflux.  RECOMMENDATIONS:  Barium swallow/upper GI series.     Barbette Hair. Arlyce Dice, MD,FACG     RDK/MEDQ  D:  11/28/2013  T:  11/28/2013  Job:  [id number]

## 2013-12-05 ENCOUNTER — Encounter: Payer: Self-pay | Admitting: Gastroenterology

## 2013-12-12 ENCOUNTER — Ambulatory Visit: Payer: Medicaid Other | Admitting: Gastroenterology

## 2014-01-01 ENCOUNTER — Telehealth: Payer: Self-pay | Admitting: Gastroenterology

## 2014-01-01 NOTE — Telephone Encounter (Signed)
Advised patient the BRAVO test was abnormal. Appointment made for follow up 01/24/14 at 3:15

## 2014-01-24 ENCOUNTER — Encounter: Payer: Self-pay | Admitting: Gastroenterology

## 2014-01-24 ENCOUNTER — Ambulatory Visit (INDEPENDENT_AMBULATORY_CARE_PROVIDER_SITE_OTHER): Payer: Medicaid Other | Admitting: Gastroenterology

## 2014-01-24 VITALS — BP 128/74 | HR 96 | Ht 71.0 in | Wt 297.5 lb

## 2014-01-24 DIAGNOSIS — K2100 Gastro-esophageal reflux disease with esophagitis, without bleeding: Secondary | ICD-10-CM

## 2014-01-24 MED ORDER — DEXLANSOPRAZOLE 60 MG PO CPDR: 60.0000 mg | DELAYED_RELEASE_CAPSULE | Freq: Two times a day (BID) | ORAL | Status: DC

## 2014-01-24 NOTE — Assessment & Plan Note (Signed)
Patient has significant acid reflux as demonstrated by a 48 hour bravo.  Study while on PPI therapy.  We again discussed weight loss which I think is not a viable option for the patient.  Bariatric surgery was also discussed.  Upper GI series was requested but not yet done.    Recommendations #1upper GI series #2 antireflux measures #3 dexilant 60 mg twice a day #4 referral to general surgery, Dr. Wenda LowMatt Martin, for consideration of fundoplication and possible bariatric surgery

## 2014-01-24 NOTE — Patient Instructions (Addendum)
You have been scheduled for an Upper GI Series at [facility]. Your appointment is on Nov 13th Friday  at 10:30am. Please arrive 15 minutes prior to your test for registration. Make sure not to eat or drink anything after midnight on the night before your test. If you need to reschedule, please call radiology at [phone]. ________________________________________________________________ An upper GI series uses x rays to help diagnose problems of the upper GI tract, which includes the esophagus, stomach, and duodenum. The duodenum is the first part of the small intestine. An upper GI series is conducted by a radiology technologist or a radiologist-a doctor who specializes in x-ray imaging-at a hospital or outpatient center. While sitting or standing in front of an x-ray machine, the patient drinks barium liquid, which is often white and has a chalky consistency and taste. The barium liquid coats the lining of the upper GI tract and makes signs of disease show up more clearly on x rays. X-ray video, called fluoroscopy, is used to view the barium liquid moving through the esophagus, stomach, and duodenum. Additional x rays and fluoroscopy are performed while the patient lies on an x-ray table. To fully coat the upper GI tract with barium liquid, the technologist or radiologist may press on the abdomen or ask the patient to change position. Patients hold still in various positions, allowing the technologist or radiologist to take x rays of the upper GI tract at different angles. If a technologist conducts the upper GI series, a radiologist will later examine the images to look for problems.  This test typically takes about 1 hour to complete. __________________________________________________________________    Discontinue Nexium  We will send in Dexilant to your pharmacy You will need to go to the Bariatric Seminar to schedule call [phone]  We will contact you with your appointment to [facility] to see Dr Wenda LowMatt  Martin

## 2014-01-24 NOTE — Progress Notes (Signed)
      History of Present Illness:  Mr. Kevin Bauer has returned following endoscopy with bravo pH study. Erosive esophagitis was identified.  Bravo pH study demonstrated significant acid reflux.  He continues to complain of burning chest discomfort.  He has to sleep upright in a chair at night.    Review of Systems: Pertinent positive and negative review of systems were noted in the above HPI section. All other review of systems were otherwise negative.    Current Medications, Allergies, Past Medical History, Past Surgical History, Family History and Social History were reviewed in [facility] electronic medical record  Vital signs were reviewed in today's medical record. Physical Exam: General: Well developed , well nourished, no acute distress   See Assessment and Plan under Problem List

## 2014-01-24 NOTE — Addendum Note (Signed)
Addended by: Marlowe KaysSTALLINGS, Eschol Auxier M on: 01/24/2014 03:31 PM   Modules accepted: Orders, Medications

## 2014-01-31 ENCOUNTER — Ambulatory Visit (HOSPITAL_COMMUNITY): Payer: Medicaid Other

## 2014-02-10 ENCOUNTER — Ambulatory Visit (HOSPITAL_COMMUNITY): Admission: RE | Admit: 2014-02-10 | Payer: Medicaid Other | Source: Ambulatory Visit

## 2014-04-02 ENCOUNTER — Encounter: Payer: Self-pay | Admitting: *Deleted

## 2014-04-02 NOTE — Progress Notes (Unsigned)
Patient ID: Kevin Bauer, male   DOB: 12/31/1990, 24 y.o.   MRN: [id number]   Patient has No Showed 2 times for [facility]

## 2015-07-11 ENCOUNTER — Encounter (HOSPITAL_COMMUNITY): Payer: Self-pay | Admitting: Family Medicine

## 2015-07-11 ENCOUNTER — Emergency Department (HOSPITAL_COMMUNITY)
Admission: EM | Admit: 2015-07-11 | Discharge: 2015-07-11 | Disposition: A | Discharge status: Home / Self Care | Payer: Medicaid Other | Attending: Emergency Medicine | Admitting: Emergency Medicine

## 2015-07-11 ENCOUNTER — Emergency Department (HOSPITAL_COMMUNITY): Payer: Medicaid Other

## 2015-07-11 DIAGNOSIS — E669 Obesity, unspecified: Secondary | ICD-10-CM | POA: Insufficient documentation

## 2015-07-11 DIAGNOSIS — R0981 Nasal congestion: Secondary | ICD-10-CM | POA: Insufficient documentation

## 2015-07-11 DIAGNOSIS — J45909 Unspecified asthma, uncomplicated: Secondary | ICD-10-CM | POA: Insufficient documentation

## 2015-07-11 DIAGNOSIS — Z79899 Other long term (current) drug therapy: Secondary | ICD-10-CM | POA: Insufficient documentation

## 2015-07-11 DIAGNOSIS — J029 Acute pharyngitis, unspecified: Secondary | ICD-10-CM | POA: Insufficient documentation

## 2015-07-11 DIAGNOSIS — B9789 Other viral agents as the cause of diseases classified elsewhere: Secondary | ICD-10-CM

## 2015-07-11 DIAGNOSIS — R1110 Vomiting, unspecified: Secondary | ICD-10-CM | POA: Insufficient documentation

## 2015-07-11 DIAGNOSIS — R05 Cough: Secondary | ICD-10-CM | POA: Insufficient documentation

## 2015-07-11 DIAGNOSIS — K219 Gastro-esophageal reflux disease without esophagitis: Secondary | ICD-10-CM | POA: Insufficient documentation

## 2015-07-11 DIAGNOSIS — J028 Acute pharyngitis due to other specified organisms: Secondary | ICD-10-CM

## 2015-07-11 LAB — RAPID STREP SCREEN (MED CTR MEBANE ONLY): STREPTOCOCCUS, GROUP A SCREEN (DIRECT): NEGATIVE

## 2015-07-11 MED ORDER — ACETAMINOPHEN 325 MG PO TABS: 650.0000 mg | ORAL_TABLET | Freq: Once | ORAL | Status: DC | PRN

## 2015-07-11 MED ORDER — ACETAMINOPHEN 160 MG/5ML PO SOLN
ORAL | Status: AC
  Filled 2015-07-11: qty 20.3

## 2015-07-11 MED ORDER — DEXAMETHASONE SODIUM PHOSPHATE 10 MG/ML IJ SOLN: 10.0000 mg | Freq: Once | INTRAMUSCULAR | Status: DC

## 2015-07-11 MED ORDER — DEXAMETHASONE SODIUM PHOSPHATE 10 MG/ML IJ SOLN
10.0000 mg | Freq: Once | INTRAMUSCULAR | Status: AC
  Administered 2015-07-11: 10 mg via INTRAVENOUS
  Filled 2015-07-11: qty 1

## 2015-07-11 MED ORDER — ACETAMINOPHEN 160 MG/5ML PO SOLN
650.0000 mg | Freq: Once | ORAL | Status: AC
  Administered 2015-07-11: 650 mg via ORAL

## 2015-07-11 MED ORDER — IBUPROFEN 200 MG PO TABS
400.0000 mg | ORAL_TABLET | Freq: Once | ORAL | Status: AC
  Administered 2015-07-11: 400 mg via ORAL
  Filled 2015-07-11: qty 2

## 2015-07-11 NOTE — ED Notes (Signed)
Pt sts also that he has been vomiting x 2 days.

## 2015-07-11 NOTE — ED Notes (Signed)
Pt here for sinus congestion, sore throat and HA.

## 2015-07-11 NOTE — Discharge Instructions (Signed)
There is not appear to be an emergent cause for your symptoms at this time. Your sore throat is likely due to a virus and should resolve on its own over the next 7-10 days. Your strep test was negative. Your chest x-ray showed no concerning findings. He may take Motrin and Tylenol as we discussed. Be sure to stay well hydrated and drink plenty of water or Gatorade. Follow-up with your doctor as needed. Return to ED for new or worsening symptoms.  Sore Throat A sore throat is pain, burning, irritation, or scratchiness of the throat. There is often pain or tenderness when swallowing or talking. A sore throat may be accompanied by other symptoms, such as coughing, sneezing, fever, and swollen neck glands. A sore throat is often the first sign of another sickness, such as a cold, flu, strep throat, or mononucleosis (commonly known as mono). Most sore throats go away without medical treatment. CAUSES  The most common causes of a sore throat include:  A viral infection, such as a cold, flu, or mono.  A bacterial infection, such as strep throat, tonsillitis, or whooping cough.  Seasonal allergies.  Dryness in the air.  Irritants, such as smoke or pollution.  Gastroesophageal reflux disease (GERD). HOME CARE INSTRUCTIONS   Only take over-the-counter medicines as directed by your caregiver.  Drink enough fluids to keep your urine clear or pale yellow.  Rest as needed.  Try using throat sprays, lozenges, or sucking on hard candy to ease any pain (if older than 4 years or as directed).  Sip warm liquids, such as broth, herbal tea, or warm water with honey to relieve pain temporarily. You may also eat or drink cold or frozen liquids such as frozen ice pops.  Gargle with salt water (mix 1 tsp salt with 8 oz of water).  Do not smoke and avoid secondhand smoke.  Put a cool-mist humidifier in your bedroom at night to moisten the air. You can also turn on a hot shower and sit in the bathroom with  the door closed for 5-10 minutes. SEEK IMMEDIATE MEDICAL CARE IF:  You have difficulty breathing.  You are unable to swallow fluids, soft foods, or your saliva.  You have increased swelling in the throat.  Your sore throat does not get better in 7 days.  You have nausea and vomiting.  You have a fever or persistent symptoms for more than 2-3 days.  You have a fever and your symptoms suddenly get worse. MAKE SURE YOU:   Understand these instructions.  Will watch your condition.  Will get help right away if you are not doing well or get worse.   This information is not intended to replace advice given to you by your health care provider. Make sure you discuss any questions you have with your health care provider.   Document Released: 04/14/2004 Document Revised: 03/28/2014 Document Reviewed: 11/13/2011 Elsevier Interactive Patient Education 2016 Elsevier Inc.  Pharyngitis Pharyngitis is a sore throat (pharynx). There is redness, pain, and swelling of your throat. HOME CARE   Drink enough fluids to keep your pee (urine) clear or pale yellow.  Only take medicine as told by your doctor.  You may get sick again if you do not take medicine as told. Finish your medicines, even if you start to feel better.  Do not take aspirin.  Rest.  Rinse your mouth (gargle) with salt water ( tsp of salt per 1 qt of water) every 1-2 hours. This will help the  pain.  If you are not at risk for choking, you can suck on hard candy or sore throat lozenges. GET HELP IF:  You have large, tender lumps on your neck.  You have a rash.  You cough up green, yellow-brown, or bloody spit. GET HELP RIGHT AWAY IF:   You have a stiff neck.  You drool or cannot swallow liquids.  You throw up (vomit) or are not able to keep medicine or liquids down.  You have very bad pain that does not go away with medicine.  You have problems breathing (not from a stuffy nose). MAKE SURE YOU:   Understand  these instructions.  Will watch your condition.  Will get help right away if you are not doing well or get worse.   This information is not intended to replace advice given to you by your health care provider. Make sure you discuss any questions you have with your health care provider.   Document Released: 08/24/2007 Document Revised: 12/26/2012 Document Reviewed: 11/12/2012 Elsevier Interactive Patient Education [employer]2016 Elsevier Inc.

## 2015-07-11 NOTE — ED Provider Notes (Signed)
CSN: [id number]     Arrival date & time 07/11/15  1419 History   First MD Initiated Contact with Patient 07/11/15 1520     Chief Complaint  Patient presents with  . Nasal Congestion  . Sore Throat     (Consider location/radiation/quality/duration/timing/severity/associated sxs/prior Treatment) HPI Kevin Bauer is a 25 y.o. male with a history of GERD, erosive esophagitis comes in for evaluation of nasal congestion and sore throat. Patient reports symptoms started yesterday around lunch time. He reports associated sore throat, intermittent nasal congestion and runny nose, nonproductive cough. 2 episodes of posttussive emesis, nonbloody and nonbilious. He denies any difficulty swallowing, changes in phonation, headache, neck pain or stiffness, chest pain, shortness of breath, abdominal pain or other medical complaints. He denies any alleviating or aggravating factors.  Past Medical History  Diagnosis Date  . GERD (gastroesophageal reflux disease)   . Obesity   . Erosive esophagitis   . Asthma     asthma  asbaby   Past Surgical History  Procedure Laterality Date  . Shoulder surgery      right  . Esophagogastroduodenoscopy  2014  . [place] ph study N/A 11/18/2013    Procedure: BRAVO PH STUDY;  Surgeon: Louis Meckel, MD;  Location: [facility];  Service: Endoscopy;  Laterality: N/A;  . Esophagogastroduodenoscopy N/A 11/18/2013    Procedure: ESOPHAGOGASTRODUODENOSCOPY (EGD);  Surgeon: Louis Meckel, MD;  Location: [facility];  Service: Endoscopy;  Laterality: N/A;   Family History  Problem Relation Age of Onset  . Hypertension Mother   . Hypertension Maternal Grandmother    Social History  Substance Use Topics  . Smoking status: Never Smoker   . Smokeless tobacco: Never Used  . Alcohol Use: No    Review of Systems A 10 point review of systems was completed and was negative except for pertinent positives and negatives as mentioned in the history of present illness      Allergies  Review of patient's allergies indicates no known allergies.  Home Medications   Prior to Admission medications   Medication Sig Start Date End Date Taking? Authorizing Provider  DM-Doxylamine-Acetaminophen (NYQUIL COLD & FLU PO) Take 30 mLs by mouth daily as needed (for cold).   Yes Historical Provider, MD  Pseudoephedrine-APAP-DM (DAYQUIL MULTI-SYMPTOM COLD/FLU PO) Take 30 mLs by mouth daily as needed (for cold).   Yes Historical Provider, MD  dexlansoprazole (DEXILANT) 60 MG capsule Take 1 capsule (60 mg total) by mouth 2 (two) times daily. 01/24/14   Louis Meckel, MD   BP 134/81 mmHg  Pulse 111  Temp(Src) 102.2 F (39 C) (Oral)  Resp 18  SpO2 97% Physical Exam  Constitutional: He is oriented to person, place, and time. He appears well-developed and well-nourished.  HENT:  Head: Normocephalic and atraumatic.  Mouth/Throat: Oropharynx is clear and moist.  Erythematous posterior oropharynx with small erythematous lesions. Airway is widely patent. No unilateral tonsillar swelling or exudate, uvula is midline, no glossal elevation or trismus. Tolerating secretions well.  Eyes: Conjunctivae are normal. Pupils are equal, round, and reactive to light. Right eye exhibits no discharge. Left eye exhibits no discharge. No scleral icterus.  Neck: Normal range of motion. Neck supple.  No meningismus or nuchal rigidity  Cardiovascular: Normal rate, regular rhythm and normal heart sounds.   Pulmonary/Chest: Effort normal and breath sounds normal. No respiratory distress. He has no wheezes. He has no rales.  Abdominal: Soft. There is no tenderness.  Musculoskeletal: He exhibits no tenderness.  Neurological: He is  alert and oriented to person, place, and time.  Cranial Nerves II-XII grossly intact  Skin: Skin is warm and dry. No rash noted.  Psychiatric: He has a normal mood and affect.  Nursing note and vitals reviewed.   ED Course  Procedures (including critical care  time) Labs Review Labs Reviewed  RAPID STREP SCREEN (NOT AT [facility])  CULTURE, GROUP A STREP [facility]    Imaging Review Dg Chest 2 View  07/11/2015  CLINICAL DATA:  Cough, fever, sore throat, headache, loss of appetite x2 days. Hx of asthma, GERD. Nonsmoker EXAM: CHEST  2 VIEW COMPARISON:  10/30/2011 FINDINGS: The heart size and mediastinal contours are within normal limits. Both lungs are clear. The visualized skeletal structures are unremarkable. IMPRESSION: No active cardiopulmonary disease. Electronically Signed   By: Norva PavlovElizabeth  Brown M.D.   On: 07/11/2015 14:51   I have personally reviewed and evaluated these images and lab results as part of my medical decision-making.   EKG Interpretation None     Meds given in ED:  Medications  acetaminophen (TYLENOL) tablet 650 mg (not administered)  acetaminophen (TYLENOL) 160 MG/5ML solution (not administered)  dexamethasone (DECADRON) injection 10 mg (not administered)  ibuprofen (ADVIL,MOTRIN) tablet 400 mg (not administered)  acetaminophen (TYLENOL) solution 650 mg (650 mg Oral Given 07/11/15 1428)    New Prescriptions   No medications on file   Filed Vitals:   07/11/15 1424  BP: 134/81  Pulse: 111  Temp: 102.2 F (39 C)  TempSrc: Oral  Resp: 18  SpO2: 97%    MDM  Pt CXR negative for acute infiltrate. Patients symptoms are consistent with URI, likely viral etiology. Discussed that antibiotics are not indicated for viral infections. Physical exam is grossly unremarkable-mild/moderate posterior oropharynx erythema, heart sounds normal with regular rate and rhythm, no tachycardia. No evidence of deep space infection or airway compromise. Pt will be discharged with symptomatic treatment.  Verbalizes understanding and is agreeable with plan. Encouraged aggressive oral rehydration, Tylenol and Motrin for fever and overall discomfort. Pt is hemodynamically stable & in NAD prior to dc. I personally rechecked the patient's temperature after  Tylenol, 99.3 Fahrenheit. Discussed strict return precautions, he understands to return to emergency department for any new or worsening symptoms. Prior to patient discharge, I discussed and reviewed this case with Dr.Knott   Final diagnoses:  Sore throat (viral)        Joycie PeekBenjamin Shye Doty, PA-C 07/11/15 1653  Lyndal Pulleyaniel Knott, MD 07/12/15 [phone]

## 2015-07-13 LAB — CULTURE, GROUP A STREP (THRC)

## 2017-06-23 ENCOUNTER — Inpatient Hospital Stay (HOSPITAL_COMMUNITY)
Admission: EM | Admit: 2017-06-23 | Discharge: 2017-06-27 | DRG: 418 | Disposition: A | Discharge status: Home / Self Care | Payer: Medicaid Other | Attending: Internal Medicine | Admitting: Internal Medicine

## 2017-06-23 ENCOUNTER — Encounter (HOSPITAL_COMMUNITY): Payer: Self-pay | Admitting: Emergency Medicine

## 2017-06-23 ENCOUNTER — Emergency Department (HOSPITAL_COMMUNITY): Payer: Medicaid Other

## 2017-06-23 ENCOUNTER — Inpatient Hospital Stay (HOSPITAL_COMMUNITY): Payer: Medicaid Other

## 2017-06-23 DIAGNOSIS — K8012 Calculus of gallbladder with acute and chronic cholecystitis without obstruction: Secondary | ICD-10-CM | POA: Diagnosis not present

## 2017-06-23 DIAGNOSIS — R945 Abnormal results of liver function studies: Secondary | ICD-10-CM | POA: Diagnosis present

## 2017-06-23 DIAGNOSIS — Z6838 Body mass index (BMI) 38.0-38.9, adult: Secondary | ICD-10-CM | POA: Diagnosis not present

## 2017-06-23 DIAGNOSIS — K8050 Calculus of bile duct without cholangitis or cholecystitis without obstruction: Secondary | ICD-10-CM

## 2017-06-23 DIAGNOSIS — K219 Gastro-esophageal reflux disease without esophagitis: Secondary | ICD-10-CM | POA: Diagnosis present

## 2017-06-23 DIAGNOSIS — J45909 Unspecified asthma, uncomplicated: Secondary | ICD-10-CM | POA: Diagnosis present

## 2017-06-23 DIAGNOSIS — R748 Abnormal levels of other serum enzymes: Secondary | ICD-10-CM | POA: Diagnosis not present

## 2017-06-23 DIAGNOSIS — K279 Peptic ulcer, site unspecified, unspecified as acute or chronic, without hemorrhage or perforation: Secondary | ICD-10-CM | POA: Diagnosis present

## 2017-06-23 DIAGNOSIS — K8070 Calculus of gallbladder and bile duct without cholecystitis without obstruction: Secondary | ICD-10-CM | POA: Diagnosis not present

## 2017-06-23 DIAGNOSIS — E669 Obesity, unspecified: Secondary | ICD-10-CM | POA: Diagnosis present

## 2017-06-23 DIAGNOSIS — N179 Acute kidney failure, unspecified: Secondary | ICD-10-CM | POA: Diagnosis present

## 2017-06-23 DIAGNOSIS — K210 Gastro-esophageal reflux disease with esophagitis: Secondary | ICD-10-CM | POA: Diagnosis present

## 2017-06-23 DIAGNOSIS — R1011 Right upper quadrant pain: Secondary | ICD-10-CM

## 2017-06-23 DIAGNOSIS — R740 Nonspecific elevation of levels of transaminase and lactic acid dehydrogenase [LDH]: Secondary | ICD-10-CM | POA: Diagnosis present

## 2017-06-23 DIAGNOSIS — K2210 Ulcer of esophagus without bleeding: Secondary | ICD-10-CM | POA: Diagnosis present

## 2017-06-23 DIAGNOSIS — R402414 Glasgow coma scale score 13-15, 24 hours or more after hospital admission: Secondary | ICD-10-CM | POA: Diagnosis present

## 2017-06-23 DIAGNOSIS — K5900 Constipation, unspecified: Secondary | ICD-10-CM | POA: Diagnosis present

## 2017-06-23 DIAGNOSIS — R109 Unspecified abdominal pain: Secondary | ICD-10-CM

## 2017-06-23 DIAGNOSIS — K8066 Calculus of gallbladder and bile duct with acute and chronic cholecystitis without obstruction: Principal | ICD-10-CM | POA: Diagnosis present

## 2017-06-23 DIAGNOSIS — R1084 Generalized abdominal pain: Secondary | ICD-10-CM | POA: Diagnosis not present

## 2017-06-23 DIAGNOSIS — K760 Fatty (change of) liver, not elsewhere classified: Secondary | ICD-10-CM | POA: Diagnosis present

## 2017-06-23 DIAGNOSIS — K8020 Calculus of gallbladder without cholecystitis without obstruction: Secondary | ICD-10-CM

## 2017-06-23 LAB — CBC
HCT: 42.7 % (ref 39.0–52.0)
HEMOGLOBIN: 14.2 g/dL (ref 13.0–17.0)
MCH: 27.7 pg (ref 26.0–34.0)
MCHC: 33.3 g/dL (ref 30.0–36.0)
MCV: 83.2 fL (ref 78.0–100.0)
Platelets: 296 10*3/uL (ref 150–400)
RBC: 5.13 MIL/uL (ref 4.22–5.81)
RDW: 14.1 % (ref 11.5–15.5)
WBC: 5.3 10*3/uL (ref 4.0–10.5)

## 2017-06-23 LAB — COMPREHENSIVE METABOLIC PANEL
ALT: 811 U/L — ABNORMAL HIGH (ref 17–63)
ANION GAP: 11 (ref 5–15)
AST: 522 U/L — ABNORMAL HIGH (ref 15–41)
Albumin: 3.7 g/dL (ref 3.5–5.0)
Alkaline Phosphatase: 179 U/L — ABNORMAL HIGH (ref 38–126)
BUN: 7 mg/dL (ref 6–20)
CALCIUM: 9 mg/dL (ref 8.9–10.3)
CHLORIDE: 105 mmol/L (ref 101–111)
CO2: 23 mmol/L (ref 22–32)
Creatinine, Ser: 1.27 mg/dL — ABNORMAL HIGH (ref 0.61–1.24)
Glucose, Bld: 107 mg/dL — ABNORMAL HIGH (ref 65–99)
Potassium: 3.4 mmol/L — ABNORMAL LOW (ref 3.5–5.1)
Sodium: 139 mmol/L (ref 135–145)
Total Bilirubin: 5.5 mg/dL — ABNORMAL HIGH (ref 0.3–1.2)
Total Protein: 7.8 g/dL (ref 6.5–8.1)

## 2017-06-23 LAB — URINALYSIS, ROUTINE W REFLEX MICROSCOPIC
Glucose, UA: NEGATIVE mg/dL
HGB URINE DIPSTICK: NEGATIVE
Ketones, ur: NEGATIVE mg/dL
LEUKOCYTES UA: NEGATIVE
Nitrite: NEGATIVE
PROTEIN: NEGATIVE mg/dL
SPECIFIC GRAVITY, URINE: 1.023 (ref 1.005–1.030)
pH: 5 (ref 5.0–8.0)

## 2017-06-23 LAB — ACETAMINOPHEN LEVEL

## 2017-06-23 LAB — LIPASE, BLOOD: LIPASE: 31 U/L (ref 11–51)

## 2017-06-23 MED ORDER — ONDANSETRON HCL 4 MG PO TABS
4.0000 mg | ORAL_TABLET | Freq: Four times a day (QID) | ORAL | Status: DC | PRN
  Administered 2017-06-23: 4 mg via ORAL
  Filled 2017-06-23: qty 1

## 2017-06-23 MED ORDER — PANTOPRAZOLE SODIUM 40 MG PO TBEC
40.0000 mg | DELAYED_RELEASE_TABLET | Freq: Every day | ORAL | Status: DC
  Administered 2017-06-25 – 2017-06-26 (×2): 40 mg via ORAL
  Filled 2017-06-23 (×2): qty 1

## 2017-06-23 MED ORDER — OXYCODONE HCL 5 MG PO TABS
10.0000 mg | ORAL_TABLET | Freq: Four times a day (QID) | ORAL | Status: DC | PRN
  Administered 2017-06-23 – 2017-06-26 (×4): 10 mg via ORAL
  Filled 2017-06-23 (×4): qty 2

## 2017-06-23 MED ORDER — TRAMADOL HCL 50 MG PO TABS
50.0000 mg | ORAL_TABLET | Freq: Four times a day (QID) | ORAL | Status: DC | PRN
  Administered 2017-06-23 – 2017-06-26 (×2): 50 mg via ORAL
  Filled 2017-06-23 (×2): qty 1

## 2017-06-23 MED ORDER — MORPHINE SULFATE (PF) 4 MG/ML IV SOLN
4.0000 mg | Freq: Once | INTRAVENOUS | Status: AC
  Administered 2017-06-23: 4 mg via INTRAVENOUS
  Filled 2017-06-23: qty 1

## 2017-06-23 MED ORDER — ACETAMINOPHEN 650 MG RE SUPP: 650.0000 mg | Freq: Four times a day (QID) | RECTAL | Status: DC | PRN

## 2017-06-23 MED ORDER — ACETAMINOPHEN 325 MG PO TABS
650.0000 mg | ORAL_TABLET | Freq: Four times a day (QID) | ORAL | Status: DC | PRN
  Administered 2017-06-23 – 2017-06-25 (×2): 650 mg via ORAL
  Filled 2017-06-23 (×2): qty 2

## 2017-06-23 MED ORDER — SODIUM CHLORIDE 0.9 % IV SOLN: INTRAVENOUS | Status: DC

## 2017-06-23 MED ORDER — SODIUM CHLORIDE 0.9 % IV SOLN
INTRAVENOUS | Status: DC
  Administered 2017-06-23: via INTRAVENOUS

## 2017-06-23 MED ORDER — SODIUM CHLORIDE 0.9 % IV BOLUS
1000.0000 mL | Freq: Once | INTRAVENOUS | Status: AC
  Administered 2017-06-23: 1000 mL via INTRAVENOUS

## 2017-06-23 MED ORDER — ONDANSETRON HCL 4 MG/2ML IJ SOLN
4.0000 mg | Freq: Four times a day (QID) | INTRAMUSCULAR | Status: DC | PRN
  Administered 2017-06-23 – 2017-06-26 (×5): 4 mg via INTRAVENOUS
  Filled 2017-06-23 (×6): qty 2

## 2017-06-23 MED ORDER — KCL IN DEXTROSE-NACL 20-5-0.45 MEQ/L-%-% IV SOLN
INTRAVENOUS | Status: AC
  Administered 2017-06-23: 1000 mL via INTRAVENOUS
  Filled 2017-06-23 (×2): qty 1000

## 2017-06-23 MED ORDER — PANTOPRAZOLE SODIUM 40 MG IV SOLR
40.0000 mg | Freq: Once | INTRAVENOUS | Status: AC
  Administered 2017-06-23: 40 mg via INTRAVENOUS
  Filled 2017-06-23: qty 40

## 2017-06-23 MED ORDER — FAMOTIDINE IN NACL 20-0.9 MG/50ML-% IV SOLN
20.0000 mg | INTRAVENOUS | Status: DC
  Administered 2017-06-23: 20 mg via INTRAVENOUS
  Filled 2017-06-23 (×3): qty 50

## 2017-06-23 NOTE — Progress Notes (Signed)
Progress Note for [facility]  I had a long discussion with the patient and his mother about the ERCP procedure, risks, and benefits.  I discussed in detail the procedure and the main risks of bleeding, perforation, and pancreatitis.  These risks are not exclusive of any other unforseen events that may occur.  The risks of not undergoing the ERCP procedure were also mentioned.  The acknowledge the risks and wish to proceed with the ERCP at 7:30 AM tomorrow.

## 2017-06-23 NOTE — ED Provider Notes (Signed)
MSE was initiated and I personally evaluated the patient and placed orders (if any) at  3:55 AM on June 23, 2017.  The patient appears stable so that the remainder of the MSE may be completed by another provider.   Patient is a 27 yo M no PMH with 1 week of RUQ pain and constipation.  No fever.  Has had N/V x 1.  TTP in RUQ on exam.  Nontoxic.  LFTS elevated with normal lipase.  Will obtain RUQ US and hepatitis panel, tylenol level.  Pt NPO now and updated with plan.   Ward, Layla MawKristen N, DO 06/23/17 [phone]

## 2017-06-23 NOTE — Consult Note (Signed)
[facility] Surgery Consult/Admission Note  Kevin Bauer 1990-12-02  [id number].    Requesting MD: Dr. Sherry Ruffing Chief Complaint/Reason for Consult: RUQ abdominal pain  HPI:   Patient is a 27 year old obese male with a history of GERD and obesity who presented to the emergency department with complaints of RUQ abdominal pain for 1 week. Patient states the pain started one week ago and has been constant since, severe at times, nonradiating with associated chills and nausea. Patient states he made himself vomit once without relief of pain. Bowel movements have been normal, he had one this morning, no urinary symptoms. No fever, CP, SOB. Ultrasound showed cholelithiasis without other evidence of acute cholecystitis. Labs revealed AST 522, ALT 811, T bili 5.5, WBC 5.3, afebrile.  ROS:  Review of Systems  Constitutional: Positive for chills. Negative for diaphoresis and fever.  HENT: Negative for sore throat.   Respiratory: Negative for cough and shortness of breath.   Cardiovascular: Negative for chest pain.  Gastrointestinal: Positive for abdominal pain, heartburn, nausea and vomiting. Negative for blood in stool, constipation and diarrhea.  Genitourinary: Negative for dysuria.  Skin: Negative for rash.  Neurological: Negative for dizziness and loss of consciousness.  All other systems reviewed and are negative.    Family History  Problem Relation Age of Onset  . Hypertension Mother   . Hypertension Maternal Grandmother     Past Medical History:  Diagnosis Date  . Asthma    asthma  asbaby  . Erosive esophagitis   . GERD (gastroesophageal reflux disease)   . Obesity     Past Surgical History:  Procedure Laterality Date  . BRAVO [place] STUDY N/A 11/18/2013   Procedure: BRAVO [place] STUDY;  Surgeon: Inda Castle, MD;  Location: [facility];  Service: Endoscopy;  Laterality: N/A;  . ESOPHAGOGASTRODUODENOSCOPY  2014  . ESOPHAGOGASTRODUODENOSCOPY N/A 11/18/2013   Procedure:  ESOPHAGOGASTRODUODENOSCOPY (EGD);  Surgeon: Inda Castle, MD;  Location: [facility];  Service: Endoscopy;  Laterality: N/A;  . SHOULDER SURGERY     right    Social History:  reports that he has never smoked. He has never used smokeless tobacco. He reports that he does not drink alcohol or use drugs.  Allergies: No Known Allergies   (Not in a hospital admission)  Blood pressure 136/75, pulse 77, temperature 99.5 F (37.5 C), temperature source Oral, resp. rate 18, height 6' 2"  (1.88 m), weight 136.1 kg (300 lb), SpO2 97 %.  Physical Exam  Constitutional: He is oriented to person, place, and time and well-developed, well-nourished, and in no distress. No distress.  obese  HENT:  Head: Normocephalic and atraumatic.  Nose: Nose normal.  Mouth/Throat: Oropharynx is clear and moist. No oropharyngeal exudate.  Eyes: Pupils are equal, round, and reactive to light. Conjunctivae are normal. Right eye exhibits no discharge. Left eye exhibits no discharge. No scleral icterus.  Neck: Normal range of motion. Neck supple. No thyromegaly present.  Cardiovascular: Normal rate, regular rhythm, normal heart sounds and intact distal pulses.  No murmur heard. Pulses:      Radial pulses are 2+ on the right side, and 2+ on the left side.       Dorsalis pedis pulses are 2+ on the right side, and 2+ on the left side.  Pulmonary/Chest: Effort normal and breath sounds normal. No respiratory distress. He has no wheezes. He has no rhonchi. He has no rales.  Abdominal: Soft. Normal appearance and bowel sounds are normal. He exhibits no distension. There is no hepatosplenomegaly.  There is tenderness in the right upper quadrant. There is no rigidity, no guarding and negative Murphy's sign.  Musculoskeletal: Normal range of motion. He exhibits no edema, tenderness or deformity.  Lymphadenopathy:    He has no cervical adenopathy.  Neurological: He is alert and oriented to person, place, and time.  Skin: Skin  is warm and dry. No rash noted. He is not diaphoretic.  Psychiatric: Mood and affect normal.  Nursing note and vitals reviewed.   Results for orders placed or performed during the hospital encounter of 06/23/17 (from the past 48 hour(s))  Urinalysis, Routine w reflex microscopic     Status: Abnormal   Collection Time: 06/23/17 12:44 AM  Result Value Ref Range   Color, Urine AMBER (A) YELLOW    Comment: BIOCHEMICALS MAY BE AFFECTED BY COLOR   APPearance CLEAR CLEAR   Specific Gravity, Urine 1.023 1.005 - 1.030   pH 5.0 5.0 - 8.0   Glucose, UA NEGATIVE NEGATIVE mg/dL   Hgb urine dipstick NEGATIVE NEGATIVE   Bilirubin Urine MODERATE (A) NEGATIVE   Ketones, ur NEGATIVE NEGATIVE mg/dL   Protein, ur NEGATIVE NEGATIVE mg/dL   Nitrite NEGATIVE NEGATIVE   Leukocytes, UA NEGATIVE NEGATIVE    Comment: Performed at [facility] [address], [place], [place] [address]  Lipase, blood     Status: None   Collection Time: 06/23/17 12:45 AM  Result Value Ref Range   Lipase 31 11 - 51 U/L    Comment: Performed at [facility] [address], [place], [place] [address]  Comprehensive metabolic panel     Status: Abnormal   Collection Time: 06/23/17 12:45 AM  Result Value Ref Range   Sodium 139 135 - 145 mmol/L   Potassium 3.4 (L) 3.5 - 5.1 mmol/L   Chloride 105 101 - 111 mmol/L   CO2 23 22 - 32 mmol/L   Glucose, Bld 107 (H) 65 - 99 mg/dL   BUN 7 6 - 20 mg/dL   Creatinine, Ser 1.27 (H) 0.61 - 1.24 mg/dL   Calcium 9.0 8.9 - 10.3 mg/dL   Total Protein 7.8 6.5 - 8.1 g/dL   Albumin 3.7 3.5 - 5.0 g/dL   AST 522 (H) 15 - 41 U/L   ALT 811 (H) 17 - 63 U/L   Alkaline Phosphatase 179 (H) 38 - 126 U/L   Total Bilirubin 5.5 (H) 0.3 - 1.2 mg/dL   GFR calc non Af Amer >60 >60 mL/min   GFR calc Af Amer >60 >60 mL/min    Comment: (NOTE) The eGFR has been calculated using the CKD EPI equation. This calculation has not been validated in all clinical situations. eGFR's persistently <60  mL/min signify possible Chronic Kidney Disease.    Anion gap 11 5 - 15    Comment: Performed at [facility] [address] [place] [address]  CBC     Status: None   Collection Time: 06/23/17 12:45 AM  Result Value Ref Range   WBC 5.3 4.0 - 10.5 K/uL   RBC 5.13 4.22 - 5.81 MIL/uL   Hemoglobin 14.2 13.0 - 17.0 g/dL   HCT 42.7 39.0 - 52.0 %   MCV 83.2 78.0 - 100.0 fL   MCH 27.7 26.0 - 34.0 pg   MCHC 33.3 30.0 - 36.0 g/dL   RDW 14.1 11.5 - 15.5 %   Platelets 296 150 - 400 K/uL    Comment: Performed at [facility], [place]  [address] [place], [place] [address]  Acetaminophen level     Status: Abnormal   Collection Time: 06/23/17  3:56 AM  Result Value Ref Range   Acetaminophen (Tylenol), Serum <10 (L) 10 - 30 ug/mL    Comment:        THERAPEUTIC CONCENTRATIONS VARY SIGNIFICANTLY. A RANGE OF 10-30 ug/mL MAY BE AN EFFECTIVE CONCENTRATION FOR MANY PATIENTS. HOWEVER, SOME ARE BEST TREATED AT CONCENTRATIONS OUTSIDE THIS RANGE. ACETAMINOPHEN CONCENTRATIONS >150 ug/mL AT 4 HOURS AFTER INGESTION AND >50 ug/mL AT 12 HOURS AFTER INGESTION ARE OFTEN ASSOCIATED WITH TOXIC REACTIONS. Performed at [facility], [place] [address] [place], [place] [address]    Dg Abdomen 1 View  Result Date: 06/23/2017 CLINICAL DATA:  Generalized abdominal pain for 1 week. No bowel movement for a few days. Right upper quadrant pain. EXAM: ABDOMEN - 1 VIEW COMPARISON:  None. FINDINGS: Gas and stool throughout the colon. No small or large bowel distention. No radiopaque stones. Visualized bones appear intact. Soft tissue contours are normal. IMPRESSION: Normal nonobstructive bowel gas pattern with stool-filled colon. Electronically Signed   By: Lucienne Capers M.D.   On: 06/23/2017 02:18   US Abdomen Limited Ruq  Result Date: 06/23/2017 CLINICAL DATA:  Right upper quadrant pain and nausea EXAM: ULTRASOUND ABDOMEN LIMITED RIGHT UPPER QUADRANT COMPARISON:  None. FINDINGS: Gallbladder:  There are numerous stones within the gallbladder. No gallbladder wall thickening nor pericholecystic fluid. There is also sludge within the gallbladder. Negative sonographic Murphy sign. Common bile duct: Diameter: 5.6 mm Liver: Heterogeneous hepatic echotexture without discrete mass. Portal vein is patent on color Doppler imaging with normal direction of blood flow towards the liver. IMPRESSION: Cholelithiasis without other evidence of acute cholecystitis. Electronically Signed   By: Ulyses Jarred M.D.   On: 06/23/2017 06:05      Assessment/Plan  GERD  obesity  RUQ pain with elevated LFTs and hyperbilirubinemia - no choledocholithiasis seen on ultrasound but concerning for retained stone in setting of elevated LFTs and T bili - recommend GI consult to further evaluate for choledocholithiasis  - We will follow along for surgical readiness for laparoscopic cholecystectomy  Thank you for the consult.  Kalman Drape, [facility] Surgery 06/23/2017, 11:42 AM Pager: [phone] Consults: [phone] Mon-Fri 7:00 am-4:30 pm Sat-Sun 7:00 am-11:30 am

## 2017-06-23 NOTE — ED Notes (Signed)
Attempted report 

## 2017-06-23 NOTE — Consult Note (Addendum)
[facility] Consult: 11:58 AM 06/23/2017  LOS: 0 days    Referring Provider: Carmon Sails PA in ED  Primary Care Physician:  Pa, [facility] Primary Gastroenterologist:  Dr. Deatra Ina.      Reason for Consultation:  Symptomatic cholelithiasis, elevated LFTs/? Choledocholithiasis.     HPI: Kevin Bauer is a 27 y.o. male.  PMH Obesity, BMI 38.  Fatty liver on 01/2012 ultrasound.  GERD.   2014 EGD.  Erosive esophagitis.   2014 Gastric emptying study was normal.  2% retention at 2 hours.   10/2013 [place] pH study 2015: significant GER.   Referred to Dr Hassell Done for consideration of bariatric surgery in 2015, however he never did meet with Dr. Hassell Done.  In the last few years he has been managing his reflux disease with as needed tablet antacids with only brief relief of symptoms.  He is lost 20 or 30 pounds in the last year through dietary effort but it has not helped relieve his reflux symptoms.  For 1 week the patient has been having pain in his right upper quadrant which at its most severe is 10/10.  He has not lost his appetite but when he eats, and sometimes when he drinks, pain is more severe.  Pain is beginning to radiate into the mid abdomen but not into his back.  No chest pain.  Nausea has not been a strong feature but he did vomit partially digested food yesterday.  His reflux symptoms are flaring.  Symptoms are not relieved by as needed and acids.  He used 800 mg ibuprofen twice in the last couple of days but this is not helping.  Spent 2 or 3 days since he had a bowel movement and his mother gave him MiraLAX this morning but he has not had a bowel movement yet.  Has not used ibuprofen.  Because of the ongoing symptoms his mom brought him to the emergency department last night.  He denies shortness of  breath and cough, if he takes a deep breath the pain is worse.  No sweats, chills, fevers.  Afebrile in the ED. t bili 5.5.  Alk phos 179.  AST/ALT 522/811.  Lipase 31.   coags ??.  CBC normal.  APAP level normal  Abdominal ultrasound: Heterogeneous hepatic echotexture without discrete mass.  Cholelithiasis without other evidence of acute cholecystitis.  Portal vein patent with normal direction of flow.    General surgeon, Dr. Donne Hazel, has seen the patient and will be planning a laparoscopic cholecystectomy once we have more information regarding the patient's bile ducts and to determine if there is presence of choledocholithiasis.   Pain improved after receiving morphine but he did not like the way it made him feel so he prefers to not use it.   Past Medical History:  Diagnosis Date  . Asthma    asthma  asbaby  . Erosive esophagitis   . GERD (gastroesophageal reflux disease)   . Obesity     Past Surgical History:  Procedure Laterality Date  . BRAVO [place] STUDY N/A 11/18/2013  Procedure: BRAVO [place] STUDY;  Surgeon: Inda Castle, MD;  Location: [facility];  Service: Endoscopy;  Laterality: N/A;  . ESOPHAGOGASTRODUODENOSCOPY  2014  . ESOPHAGOGASTRODUODENOSCOPY N/A 11/18/2013   Procedure: ESOPHAGOGASTRODUODENOSCOPY (EGD);  Surgeon: Inda Castle, MD;  Location: [facility];  Service: Endoscopy;  Laterality: N/A;  . SHOULDER SURGERY     right    Prior to Admission medications   Ibuprofen 800 mg.  Taken twice in the last 48 hours. Alka-Seltzer antacid tablets as needed MiraLAX, 1 dose taken this morning.  Scheduled Meds:  Infusions:  PRN Meds:    Allergies as of 06/23/2017  . (No Known Allergies)    Family History  Problem Relation Age of Onset  . Hypertension Mother   . Hypertension Maternal Grandmother     Social History   Socioeconomic History  . Marital status: Single    Spouse name: Not on file  . Number of children: Not on file  . Years of education: Not  on file  . Highest education level: Not on file  Occupational History  . Not on file  Social Needs  . Financial resource strain: Not on file  . Food insecurity:    Worry: Not on file    Inability: Not on file  . Transportation needs:    Medical: Not on file    Non-medical: Not on file  Tobacco Use  . Smoking status: Never Smoker  . Smokeless tobacco: Never Used  Substance and Sexual Activity  . Alcohol use: No  . Drug use: No  . Sexual activity: Never  Lifestyle  . Physical activity:    Days per week: Not on file    Minutes per session: Not on file  . Stress: Not on file  Relationships  . Social connections:    Talks on phone: Not on file    Gets together: Not on file    Attends religious service: Not on file    Active member of club or organization: Not on file    Attends meetings of clubs or organizations: Not on file    Relationship status: Not on file  . Intimate partner violence:    Fear of current or ex partner: Not on file    Emotionally abused: Not on file    Physically abused: Not on file    Forced sexual activity: Not on file  Other Topics Concern  . Not on file  Social History Narrative  . Not on file    REVIEW OF SYSTEMS: Constitutional: Patient feels a bit weak and fatigued but nothing profound. ENT:  No nose bleeds Pulm: No shortness of breath.  The pain in the right upper quadrant is worse if he takes a deep breath. CV:  No palpitations, no LE edema.  GU:  No hematuria, no frequency GI:  Per HPI Heme: No unusual bleeding or bruising. Transfusions: None Neuro:  No headaches, no peripheral tingling or numbness.  No seizures.  No presyncope. Derm:  No itching, no rash or sores.  Endocrine:  No sweats or chills.  No polyuria or dysuria Immunization: Did not inquire as to vaccinations. Travel:  None beyond local counties in last few months.    PHYSICAL EXAM: Vital signs in last 24 hours: Vitals:   06/23/17 1030 06/23/17 1045  BP: 135/80 136/75    Pulse: 79 77  Resp:    Temp:    SpO2: 100% 97%   Wt Readings from Last 3 Encounters:  06/23/17 300 lb (136.1 kg)  01/24/14 297 lb 8 oz (134.9 kg)  11/18/13 295 lb (133.8 kg)    General: Obese, pleasant, somewhat uncomfortable but not toxic or acutely ill-appearing AAM. Head: No facial asymmetry or swelling.  No signs of head trauma. Eyes: No scleral icterus.  No conjunctival pallor.  EOMI. Ears: Not hard of hearing. Nose: No congestion or discharge. Mouth: Oral mucosa moist, pink, clear.  Good dentition.  Tongue midline. Neck: No JVD, no masses, no thyromegaly. Lungs: Clear bilaterally.  Somewhat reduced breath sounds but no dyspnea and no cough. Heart: RRR.  No MRG.  S1, S2 present. Abdomen: Soft, obese.  Bowel sounds normal but hypoactive.  Tender with some guarding in the right upper quadrant.  No rebound..   Rectal: Deferred. Musc/Skeltl: No gross joint swelling, redness or deformities. Extremities: No CCE.  Feet are warm with brisk capillary refill. Neurologic: Alert.  Oriented x3.  Fluid speech.  Moves all 4 limbs, no tremors.  No gross neurologic deficits. Skin: No jaundice. Nodes: No cervical adenopathy. Psych: Pleasant, calm, cooperative.  Intake/Output from previous day: No intake/output data recorded. Intake/Output this shift: Total I/O In: 1000 [IV Piggyback:1000] Out: -   LAB RESULTS: Recent Labs    06/23/17 0045  WBC 5.3  HGB 14.2  HCT 42.7  PLT 296   BMET Lab Results  Component Value Date   NA 139 06/23/2017   NA 137 01/31/2012   K 3.4 (L) 06/23/2017   K 3.7 01/31/2012   CL 105 06/23/2017   CL 100 01/31/2012   CO2 23 06/23/2017   CO2 25 01/31/2012   GLUCOSE 107 (H) 06/23/2017   GLUCOSE 90 01/31/2012   BUN 7 06/23/2017   BUN 9 01/31/2012   CREATININE 1.27 (H) 06/23/2017   CREATININE 0.93 01/31/2012   CALCIUM 9.0 06/23/2017   CALCIUM 9.6 01/31/2012   LFT Recent Labs    06/23/17 0045  PROT 7.8  ALBUMIN 3.7  AST 522*  ALT 811*   ALKPHOS 179*  BILITOT 5.5*   PT/INR No results found for: INR, PROTIME Hepatitis Panel No results for input(s): HEPBSAG, HCVAB, HEPAIGM, HEPBIGM in the last 72 hours. Lipase     Component Value Date/Time   LIPASE 31 06/23/2017 0045    Drugs of Abuse  No results found for: LABOPIA, COCAINSCRNUR, LABBENZ, AMPHETMU, THCU, LABBARB   RADIOLOGY STUDIES: Dg Abdomen 1 View  Result Date: 06/23/2017 CLINICAL DATA:  Generalized abdominal pain for 1 week. No bowel movement for a few days. Right upper quadrant pain. EXAM: ABDOMEN - 1 VIEW COMPARISON:  None. FINDINGS: Gas and stool throughout the colon. No small or large bowel distention. No radiopaque stones. Visualized bones appear intact. Soft tissue contours are normal. IMPRESSION: Normal nonobstructive bowel gas pattern with stool-filled colon. Electronically Signed   By: Lucienne Capers M.D.   On: 06/23/2017 02:18   US Abdomen Limited Ruq  Result Date: 06/23/2017 CLINICAL DATA:  Right upper quadrant pain and nausea EXAM: ULTRASOUND ABDOMEN LIMITED RIGHT UPPER QUADRANT COMPARISON:  None. FINDINGS: Gallbladder: There are numerous stones within the gallbladder. No gallbladder wall thickening nor pericholecystic fluid. There is also sludge within the gallbladder. Negative sonographic Murphy sign. Common bile duct: Diameter: 5.6 mm Liver: Heterogeneous hepatic echotexture without discrete mass. Portal vein is patent on color Doppler imaging with normal direction of blood flow towards the liver. IMPRESSION: Cholelithiasis without other evidence of acute cholecystitis. Electronically Signed   By: Ulyses Jarred M.D.   On: 06/23/2017 06:05     IMPRESSION:   *  RUQ pain with cholelithiasis.  Suspect acute cholecystitis.  LFTs are quite elevated, more than would be normal for acute cholecystitis.  Wonder if he has obstructing choledocholithiasis?  *   GERD.  Not well controlled but he is taking very suboptimal medication therapy consisting of as needed  antacids.    PLAN:     *   Ordered MRI/MRCP.  Keep patient n.p.o. for now but after the MRI can start clear liquids.  Will need to be admitted to the hospitalist service.  Do not see indication for abx.    *   Pending labs include acute hepatitis panel and HIV, RPR.  Ordered Coags and CMET for AM.    *   Added Protonix.  Azucena Freed  06/23/2017, 11:58 AM Phone [phone]   Attending physician's note   I have taken an history, reviewed the chart and examined the patient. I agree with the Advanced Practitioner's note, impression and recommendations.   27 year old very pleasant male with erosive esophagitis, obesity, fatty liver admitted with biliary colic with abnormal liver function tests.  He could have choledocholithiasis.  He is awaiting MRCP.  If MRCP is positive, then would proceed with ERCP with possible biliary sphincterotomy and stone extraction.  If the MRCP is negative, then would recommend laparoscopic cholecystectomy with Intra-Op cholangiogram and possibly liver biopsy.  Carmell Austria, MD

## 2017-06-23 NOTE — ED Provider Notes (Addendum)
[facility] EMERGENCY DEPARTMENT Provider Note   CSN: [id number] Arrival date & time: 06/23/17  0034     History   Chief Complaint Chief Complaint  Patient presents with  . Abdominal Pain    HPI Kevin Bauer is a 27 y.o. male h/o esophagitis and GERD here for RUQ abdominal pain x 1 week worsening last night, radiates to middle upper stomach and constant. Associated with mild nausea. Constipated x 4 days, took miralax yesterday and had normal, formed non bloody BM today. Has noticed very dark urine. Has been feeling hot today but no frank fever, chills.   H/o acid reflux, frequently takes OTC acid reflux meds No abdominal surgeries  No known probnlems with gall bladder or liver Has taken ibuprofen for pain, does not use tylenol No ETOH use No recent travel  HPI  Past Medical History:  Diagnosis Date  . Asthma    asthma  asbaby  . Erosive esophagitis   . GERD (gastroesophageal reflux disease)   . Obesity     Patient Active Problem List   Diagnosis Date Noted  . Esophageal reflux 04/17/2012    Past Surgical History:  Procedure Laterality Date  . BRAVO [place] STUDY N/A 11/18/2013   Procedure: BRAVO [place] STUDY;  Surgeon: Louis Meckel, MD;  Location: [facility];  Service: Endoscopy;  Laterality: N/A;  . ESOPHAGOGASTRODUODENOSCOPY  2014  . ESOPHAGOGASTRODUODENOSCOPY N/A 11/18/2013   Procedure: ESOPHAGOGASTRODUODENOSCOPY (EGD);  Surgeon: Louis Meckel, MD;  Location: [facility];  Service: Endoscopy;  Laterality: N/A;  . SHOULDER SURGERY     right        Home Medications    Prior to Admission medications   Medication Sig Start Date End Date Taking? Authorizing Provider  ibuprofen (ADVIL,MOTRIN) 200 MG tablet Take 800 mg by mouth every 6 (six) hours as needed for moderate pain.   Yes [provider]  Multiple Vitamins-Minerals (MULTI-VITAMIN GUMMIES) CHEW Chew 1 tablet by mouth once.   Yes [provider]  dexlansoprazole  (DEXILANT) 60 MG capsule Take 1 capsule (60 mg total) by mouth 2 (two) times daily. Patient not taking: Reported on 06/23/2017 01/24/14   Louis Meckel, MD    Family History Family History  Problem Relation Age of Onset  . Hypertension Mother   . Hypertension Maternal Grandmother     Social History Social History   Tobacco Use  . Smoking status: Never Smoker  . Smokeless tobacco: Never Used  Substance Use Topics  . Alcohol use: No  . Drug use: No     Allergies   Patient has no known allergies.   Review of Systems Review of Systems  Gastrointestinal: Positive for abdominal pain, constipation (resolved) and nausea.  All other systems reviewed and are negative.    Physical Exam Updated Vital Signs BP 125/87   Pulse (!) 136   Temp 99.5 F (37.5 C) (Oral)   Resp 18   Ht 6\' 2"  (1.88 m)   Wt 136.1 kg (300 lb)   SpO2 99%   BMI 38.52 kg/m   Physical Exam  Constitutional: He is oriented to person, place, and time. He appears well-developed and well-nourished. No distress.  Non toxic.  HENT:  Head: Normocephalic and atraumatic.  Nose: Nose normal.  Mouth/Throat: No oropharyngeal exudate.  Moist mucous membranes   Eyes: Pupils are equal, round, and reactive to light. Conjunctivae and EOM are normal.  Neck: Normal range of motion.  Cardiovascular: Normal rate, regular rhythm, normal heart sounds  and intact distal pulses.  No murmur heard. 2+ DP and radial pulses bilaterally. No LE edema.   Pulmonary/Chest: Effort normal and breath sounds normal.  Abdominal: Soft. Bowel sounds are normal. There is tenderness in the right upper quadrant and epigastric area.  No G/R/R. No suprapubic or CVA tenderness. Negative murphy's and mcburney's.   Musculoskeletal: Normal range of motion. He exhibits no deformity.  Neurological: He is alert and oriented to person, place, and time.  Skin: Skin is warm and dry. Capillary refill takes less than 2 seconds.  Psychiatric: He has a  normal mood and affect. His behavior is normal. Judgment and thought content normal.  Nursing note and vitals reviewed.    ED Treatments / Results  Labs (all labs ordered are listed, but only abnormal results are displayed) Labs Reviewed  COMPREHENSIVE METABOLIC PANEL - Abnormal; Notable for the following components:      Result Value   Potassium 3.4 (*)    Glucose, Bld 107 (*)    Creatinine, Ser 1.27 (*)    AST 522 (*)    ALT 811 (*)    Alkaline Phosphatase 179 (*)    Total Bilirubin 5.5 (*)    All other components within normal limits  URINALYSIS, ROUTINE W REFLEX MICROSCOPIC - Abnormal; Notable for the following components:   Color, Urine AMBER (*)    Bilirubin Urine MODERATE (*)    All other components within normal limits  ACETAMINOPHEN LEVEL - Abnormal; Notable for the following components:   Acetaminophen (Tylenol), Serum <10 (*)    All other components within normal limits  LIPASE, BLOOD  CBC  HEPATITIS PANEL, ACUTE  HIV ANTIBODY (ROUTINE TESTING)  RPR    EKG None  Radiology Dg Abdomen 1 View  Result Date: 06/23/2017 CLINICAL DATA:  Generalized abdominal pain for 1 week. No bowel movement for a few days. Right upper quadrant pain. EXAM: ABDOMEN - 1 VIEW COMPARISON:  None. FINDINGS: Gas and stool throughout the colon. No small or large bowel distention. No radiopaque stones. Visualized bones appear intact. Soft tissue contours are normal. IMPRESSION: Normal nonobstructive bowel gas pattern with stool-filled colon. Electronically Signed   By: Burman Nieves M.D.   On: 06/23/2017 02:18   US Abdomen Limited Ruq  Result Date: 06/23/2017 CLINICAL DATA:  Right upper quadrant pain and nausea EXAM: ULTRASOUND ABDOMEN LIMITED RIGHT UPPER QUADRANT COMPARISON:  None. FINDINGS: Gallbladder: There are numerous stones within the gallbladder. No gallbladder wall thickening nor pericholecystic fluid. There is also sludge within the gallbladder. Negative sonographic Murphy sign.  Common bile duct: Diameter: 5.6 mm Liver: Heterogeneous hepatic echotexture without discrete mass. Portal vein is patent on color Doppler imaging with normal direction of blood flow towards the liver. IMPRESSION: Cholelithiasis without other evidence of acute cholecystitis. Electronically Signed   By: Deatra Robinson M.D.   On: 06/23/2017 06:05    Procedures Procedures (including critical care time)  Medications Ordered in ED Medications  pantoprazole (PROTONIX) EC tablet 40 mg (has no administration in time range)  sodium chloride 0.9 % bolus 1,000 mL (0 mLs Intravenous Stopped 06/23/17 1007)  morphine 4 MG/ML injection 4 mg (4 mg Intravenous Given 06/23/17 0906)  pantoprazole (PROTONIX) injection 40 mg (40 mg Intravenous Given 06/23/17 1223)     Initial Impression / Assessment and Plan / ED Course  I have reviewed the triage vital signs and the nursing notes.  Pertinent labs & imaging results that were available during my care of the patient were reviewed by me  and considered in my medical decision making (see chart for details).  Clinical Course as of Jun 23 1337  Fri Jun 23, 2017  0828 AST(!): 522 [CG]  0830 ALT(!): 811 [CG]  0830 Alkaline Phosphatase(!): 179 [CG]  0830 Alkaline Phosphatase(!): 179 [CG]  0830 Total Bilirubin(!): 5.5 [CG]  0830 Total Bilirubin(!): 5.5 [CG]  0830 Bilirubin Urine(!): MODERATE [CG]  0830 Creatinine(!): 1.27 [CG]  1000 Spoke to Sarah with GI, will come see patient in ED. Recommending general surgery consult, ordered.    [CG]  1046 Spoke to SalineKimberly with general surgery, patient is on her list to see, interested in hearing GI recommendations. Awaiting GI eval.    [CG]  1050 Spoke to patient and mother at bedside, updated them on plan to Gi/surgery to see in ED and awaiting recommendations.    [CG]    Clinical Course User Index [CG] Liberty HandyGibbons, Ivin Rosenbloom J, PA-C   Considering viral hepatitis, symptomatic cholelithiasis given elevated LFTs.   On exam pt is  non toxic, HD stable with focal RUQ tenderness. No peritonitis. No ETOH or APAP use. No recent travel or signs of systemic infections.   1130: GI and general surgery has been consulted, awaiting recommendations.  Final Clinical Impressions(s) / ED Diagnoses   1258: GI and general surgery have discussed patient. Recommending admission to medicine team, MR/MRCP ordered. Awaiting hospitalist consult.   1338: Spoke to hospitalist (Dr Melynda RippleHobbs) who has accepted patient.Re-evaluated patient, asleep reports pain is tolerable.  Final diagnoses:  Abdominal pain  Right upper quadrant abdominal pain    ED Discharge Orders    None       Jerrell MylarGibbons, Juaquin Ludington J, PA-C 06/23/17 1339    Tegeler, Canary Brimhristopher J, MD 06/23/17 1840

## 2017-06-23 NOTE — H&P (Signed)
[facility] History and Physical  Kevin Harklbert Dorame ZOX:[id number] DOB: 05/28/1990 DOA: 06/23/2017  Referring physician:  PCP: Daiva NakayamaPa, [facility]   Chief Complaint: "It just started hurting."  HPI: Kevin Bauer is a 27 y.o. male  Past medical history significant for reflux and obesity presents with right upper quadrant pain.  Patient states the pain started approximately 1 week ago.  No history of gallbladder issues.  Denies any trauma to the abdomen.  Patient states that the pain became so intense that at times he will vomit secondary to due to pain.  Patient's oral intake has decreased due to this.  Patient denies eating a fatty diet.  ED Course: General surgery consulted by EDP who felt this without acute cholecystitis.  Ultrasound did show gallstones.  GI consulted and have plan to do a MRCP.  Hospitalist consulted for admission.   Review of Systems:  As per HPI otherwise 10 point review of systems negative.    Past Medical History:  Diagnosis Date  . Asthma    asthma  asbaby  . Erosive esophagitis   . GERD (gastroesophageal reflux disease)   . Obesity    Past Surgical History:  Procedure Laterality Date  . BRAVO PH STUDY N/A 11/18/2013   Procedure: BRAVO PH STUDY;  Surgeon: Louis Meckelobert D Kaplan, MD;  Location: [facility];  Service: Endoscopy;  Laterality: N/A;  . ESOPHAGOGASTRODUODENOSCOPY  2014  . ESOPHAGOGASTRODUODENOSCOPY N/A 11/18/2013   Procedure: ESOPHAGOGASTRODUODENOSCOPY (EGD);  Surgeon: Louis Meckelobert D Kaplan, MD;  [facility];  Service: Endoscopy;  Laterality: N/A;  . SHOULDER SURGERY     right   Social History:  reports that he has never smoked. He has never used smokeless tobacco. He reports that he does not drink alcohol or use drugs.  No Known Allergies  Family History  Problem Relation Age of Onset  . Hypertension Mother   . Hypertension Maternal Grandmother      Prior to Admission medications   Medication Sig Start Date End Date Taking?  Authorizing Provider  ibuprofen (ADVIL,MOTRIN) 200 MG tablet Take 800 mg by mouth every 6 (six) hours as needed for moderate pain.   Yes [provider]  Multiple Vitamins-Minerals (MULTI-VITAMIN GUMMIES) CHEW Chew 1 tablet by mouth once.   Yes [provider]  dexlansoprazole (DEXILANT) 60 MG capsule Take 1 capsule (60 mg total) by mouth 2 (two) times daily. Patient not taking: Reported on 06/23/2017 01/24/14   Louis MeckelKaplan, Robert D, MD   Physical Exam: Vitals:   06/23/17 1330 06/23/17 1345 06/23/17 1400 06/23/17 1415  BP: 121/77 128/84 124/70 131/73  Pulse: 66 (!) 59 67 85  Resp:      Temp:      TempSrc:      SpO2: 96% 98% 99% 100%  Weight:      Height:        Wt Readings from Last 3 Encounters:  06/23/17 136.1 kg (300 lb)  01/24/14 134.9 kg (297 lb 8 oz)  11/18/13 133.8 kg (295 lb)    General:  Appears calm and comfortable; A&Ox3 Eyes:  PERRL, EOMI, normal lids, iris ENT:  grossly normal hearing, lips & tongue Neck:  no LAD, masses or thyromegaly Cardiovascular:  RRR, no m/r/g. No LE edema.  Respiratory:  CTA bilaterally, no w/r/r. Normal respiratory effort. Abdomen:  soft, ND, RUQ TTP Skin:  no rash or induration seen on limited exam Musculoskeletal:  grossly normal tone BUE/BLE Psychiatric:  grossly normal mood and affect, speech fluent and appropriate Neurologic:  CN 2-12 grossly intact, moves all extremities in coordinated fashion.          Labs on Admission:  Basic Metabolic Panel: Recent Labs  Lab 06/23/17 0045  NA 139  K 3.4*  CL 105  CO2 23  GLUCOSE 107*  BUN 7  CREATININE 1.27*  CALCIUM 9.0   Liver Function Tests: Recent Labs  Lab 06/23/17 0045  AST 522*  ALT 811*  ALKPHOS 179*  BILITOT 5.5*  PROT 7.8  ALBUMIN 3.7   Recent Labs  Lab 06/23/17 0045  LIPASE 31   No results for input(s): AMMONIA in the last 168 hours. CBC: Recent Labs  Lab 06/23/17 0045  WBC 5.3  HGB 14.2  HCT 42.7  MCV 83.2  PLT 296   Cardiac  Enzymes: No results for input(s): CKTOTAL, CKMB, CKMBINDEX, TROPONINI in the last 168 hours.  BNP (last 3 results) No results for input(s): BNP in the last 8760 hours.  ProBNP (last 3 results) No results for input(s): PROBNP in the last 8760 hours.   Serum creatinine: 1.27 mg/dL (H) 40/98/11 9147 Estimated creatinine clearance: 130.5 mL/min (A)  CBG: No results for input(s): GLUCAP in the last 168 hours.  Radiological Exams on Admission: Dg Abdomen 1 View  Result Date: 06/23/2017 CLINICAL DATA:  Generalized abdominal pain for 1 week. No bowel movement for a few days. Right upper quadrant pain. EXAM: ABDOMEN - 1 VIEW COMPARISON:  None. FINDINGS: Gas and stool throughout the colon. No small or large bowel distention. No radiopaque stones. Visualized bones appear intact. Soft tissue contours are normal. IMPRESSION: Normal nonobstructive bowel gas pattern with stool-filled colon. Electronically Signed   By: Burman Nieves M.D.   On: 06/23/2017 02:18   US Abdomen Limited Ruq  Result Date: 06/23/2017 CLINICAL DATA:  Right upper quadrant pain and nausea EXAM: ULTRASOUND ABDOMEN LIMITED RIGHT UPPER QUADRANT COMPARISON:  None. FINDINGS: Gallbladder: There are numerous stones within the gallbladder. No gallbladder wall thickening nor pericholecystic fluid. There is also sludge within the gallbladder. Negative sonographic Murphy sign. Common bile duct: Diameter: 5.6 mm Liver: Heterogeneous hepatic echotexture without discrete mass. Portal vein is patent on color Doppler imaging with normal direction of blood flow towards the liver. IMPRESSION: Cholelithiasis without other evidence of acute cholecystitis. Electronically Signed   By: Deatra Robinson M.D.   On: 06/23/2017 06:05    EKG: no new  Assessment/Plan Principal Problem:   Elevated liver enzymes Active Problems:   Esophageal reflux   Asthma  Elevated liver enzymes IVF GI consulted Plan is for MRCP  Asthma Prn albuterol  GERD Pepcid  qd  Code Status: FC  DVT Prophylaxis: SCDs Family Communication: none available Disposition Plan: Pending Improvement  Status: med surg inpt  Haydee Salter, MD Family Medicine [facility] [email or web address] Password [email or web address]

## 2017-06-23 NOTE — ED Triage Notes (Signed)
Pt reports gen abd pain X1 week, pt also reports no BM X few days. Denies N/V/D. Pain is sharp, 10/10.

## 2017-06-24 ENCOUNTER — Encounter (HOSPITAL_COMMUNITY)
Admission: EM | Disposition: A | Discharge status: Home / Self Care | Payer: Self-pay | Source: Home / Self Care | Attending: Internal Medicine

## 2017-06-24 ENCOUNTER — Inpatient Hospital Stay (HOSPITAL_COMMUNITY): Payer: Medicaid Other

## 2017-06-24 ENCOUNTER — Inpatient Hospital Stay (HOSPITAL_COMMUNITY): Payer: Medicaid Other | Admitting: Anesthesiology

## 2017-06-24 ENCOUNTER — Encounter (HOSPITAL_COMMUNITY): Payer: Self-pay

## 2017-06-24 DIAGNOSIS — K219 Gastro-esophageal reflux disease without esophagitis: Secondary | ICD-10-CM

## 2017-06-24 DIAGNOSIS — K8070 Calculus of gallbladder and bile duct without cholecystitis without obstruction: Secondary | ICD-10-CM

## 2017-06-24 HISTORY — PX: ERCP: SHX5425

## 2017-06-24 LAB — COMPREHENSIVE METABOLIC PANEL
ALBUMIN: 3.9 g/dL (ref 3.5–5.0)
ALK PHOS: 228 U/L — AB (ref 38–126)
ALT: 808 U/L — ABNORMAL HIGH (ref 17–63)
ANION GAP: 15 (ref 5–15)
AST: 384 U/L — ABNORMAL HIGH (ref 15–41)
BUN: <5 mg/dL — ABNORMAL LOW (ref 6–20)
CALCIUM: 9.5 mg/dL (ref 8.9–10.3)
CO2: 20 mmol/L — AB (ref 22–32)
Chloride: 103 mmol/L (ref 101–111)
Creatinine, Ser: 1.06 mg/dL (ref 0.61–1.24)
GFR calc non Af Amer: >60 mL/min (ref >60)
GLUCOSE: 125 mg/dL — AB (ref 65–99)
POTASSIUM: 3.7 mmol/L (ref 3.5–5.1)
SODIUM: 138 mmol/L (ref 135–145)
Total Bilirubin: 7.2 mg/dL — ABNORMAL HIGH (ref 0.3–1.2)
Total Protein: 8.7 g/dL — ABNORMAL HIGH (ref 6.5–8.1)

## 2017-06-24 LAB — CBC
HEMATOCRIT: 44 % (ref 39.0–52.0)
HEMOGLOBIN: 14.2 g/dL (ref 13.0–17.0)
MCH: 27.2 pg (ref 26.0–34.0)
MCHC: 32.3 g/dL (ref 30.0–36.0)
MCV: 84.3 fL (ref 78.0–100.0)
Platelets: 292 10*3/uL (ref 150–400)
RBC: 5.22 MIL/uL (ref 4.22–5.81)
RDW: 14.7 % (ref 11.5–15.5)
WBC: 6.6 10*3/uL (ref 4.0–10.5)

## 2017-06-24 LAB — PROTIME-INR
INR: 1.03
Prothrombin Time: 13.4 seconds (ref 11.4–15.2)

## 2017-06-24 LAB — HEPATITIS PANEL, ACUTE
HEP B C IGM: NEGATIVE
Hep A IgM: NEGATIVE
Hepatitis B Surface Ag: NEGATIVE

## 2017-06-24 LAB — RPR: RPR: NONREACTIVE

## 2017-06-24 LAB — HIV ANTIBODY (ROUTINE TESTING W REFLEX): HIV SCREEN 4TH GENERATION: NONREACTIVE

## 2017-06-24 SURGERY — ERCP, WITH INTERVENTION IF INDICATED
Anesthesia: General

## 2017-06-24 MED ORDER — SODIUM CHLORIDE 0.9 % IV SOLN: INTRAVENOUS | Status: DC

## 2017-06-24 MED ORDER — MORPHINE SULFATE (PF) 2 MG/ML IV SOLN
2.0000 mg | INTRAVENOUS | Status: AC | PRN
  Administered 2017-06-24 (×2): 2 mg via INTRAVENOUS
  Filled 2017-06-24 (×2): qty 1

## 2017-06-24 MED ORDER — INDOMETHACIN 50 MG RE SUPP
RECTAL | Status: AC
  Filled 2017-06-24: qty 2

## 2017-06-24 MED ORDER — FENTANYL CITRATE (PF) 100 MCG/2ML IJ SOLN
INTRAMUSCULAR | Status: DC | PRN
  Administered 2017-06-24: 100 ug via INTRAVENOUS

## 2017-06-24 MED ORDER — SODIUM CHLORIDE 0.9 % IV SOLN
INTRAVENOUS | Status: DC | PRN
  Administered 2017-06-24: 30 mL

## 2017-06-24 MED ORDER — MIDAZOLAM HCL 5 MG/5ML IJ SOLN
INTRAMUSCULAR | Status: DC | PRN
  Administered 2017-06-24 (×2): 1 mg via INTRAVENOUS

## 2017-06-24 MED ORDER — SUCCINYLCHOLINE CHLORIDE 20 MG/ML IJ SOLN
INTRAMUSCULAR | Status: DC | PRN
  Administered 2017-06-24: 80 mg via INTRAVENOUS

## 2017-06-24 MED ORDER — IOPAMIDOL (ISOVUE-300) INJECTION 61%
INTRAVENOUS | Status: AC
  Filled 2017-06-24: qty 50

## 2017-06-24 MED ORDER — GI COCKTAIL ~~LOC~~
30.0000 mL | Freq: Once | ORAL | Status: AC
  Administered 2017-06-24: 30 mL via ORAL
  Filled 2017-06-24: qty 30

## 2017-06-24 MED ORDER — ROCURONIUM 10MG/ML (10ML) SYRINGE FOR MEDFUSION PUMP - OPTIME
INTRAVENOUS | Status: DC | PRN
  Administered 2017-06-24: 40 mg via INTRAVENOUS

## 2017-06-24 MED ORDER — PROPOFOL 10 MG/ML IV BOLUS
INTRAVENOUS | Status: DC | PRN
  Administered 2017-06-24: 175 mg via INTRAVENOUS
  Administered 2017-06-24: 25 mg via INTRAVENOUS

## 2017-06-24 MED ORDER — SUGAMMADEX SODIUM 200 MG/2ML IV SOLN
INTRAVENOUS | Status: DC | PRN
  Administered 2017-06-24: 200 mg via INTRAVENOUS

## 2017-06-24 MED ORDER — ONDANSETRON HCL 4 MG/2ML IJ SOLN
INTRAMUSCULAR | Status: DC | PRN
  Administered 2017-06-24: 4 mg via INTRAVENOUS

## 2017-06-24 MED ORDER — INDOMETHACIN 50 MG RE SUPP
RECTAL | Status: DC | PRN
  Administered 2017-06-24 (×2): 50 mg via RECTAL

## 2017-06-24 MED ORDER — MORPHINE SULFATE (PF) 2 MG/ML IV SOLN
2.0000 mg | Freq: Once | INTRAVENOUS | Status: AC
  Administered 2017-06-24: 2 mg via INTRAVENOUS
  Filled 2017-06-24: qty 1

## 2017-06-24 MED ORDER — CIPROFLOXACIN IN D5W 400 MG/200ML IV SOLN
INTRAVENOUS | Status: AC
  Filled 2017-06-24: qty 200

## 2017-06-24 MED ORDER — GLUCAGON HCL RDNA (DIAGNOSTIC) 1 MG IJ SOLR
INTRAMUSCULAR | Status: AC
  Filled 2017-06-24: qty 1

## 2017-06-24 MED ORDER — CIPROFLOXACIN IN D5W 400 MG/200ML IV SOLN
INTRAVENOUS | Status: DC | PRN
  Administered 2017-06-24: 400 mg via INTRAVENOUS

## 2017-06-24 NOTE — Anesthesia Preprocedure Evaluation (Signed)
Anesthesia Evaluation  Patient identified by MRN, date of birth, ID band Patient awake    Reviewed: Allergy & Precautions, NPO status , Patient's Chart, lab work & pertinent test results  History of Anesthesia Complications Negative for: history of anesthetic complications  Airway Mallampati: II  TM Distance: >3 FB Neck ROM: Full    Dental  (+) Teeth Intact   Pulmonary asthma ,    breath sounds clear to auscultation       Cardiovascular negative cardio ROS   Rhythm:Regular     Neuro/Psych negative neurological ROS  negative psych ROS   GI/Hepatic Neg liver ROS, PUD, GERD  Medicated,  Endo/Other  negative endocrine ROS  Renal/GU negative Renal ROS     Musculoskeletal negative musculoskeletal ROS (+)   Abdominal   Peds  Hematology negative hematology ROS (+)   Anesthesia Other Findings   Reproductive/Obstetrics                             Anesthesia Physical Anesthesia Plan  ASA: II  Anesthesia Plan: General   Post-op Pain Management:    Induction: Intravenous, Rapid sequence and Cricoid pressure planned  PONV Risk Score and Plan: 2 and Ondansetron and Dexamethasone  Airway Management Planned: Oral ETT  Additional Equipment: None  Intra-op Plan:   Post-operative Plan: Extubation in OR  Informed Consent: I have reviewed the patients History and Physical, chart, labs and discussed the procedure including the risks, benefits and alternatives for the proposed anesthesia with the patient or authorized representative who has indicated his/her understanding and acceptance.   Dental advisory given  Plan Discussed with: CRNA and Surgeon  Anesthesia Plan Comments:         Anesthesia Quick Evaluation

## 2017-06-24 NOTE — Progress Notes (Signed)
[facility] Progress Note  Day of Surgery  Subjective: CC-  Patient back in his room after successful ERCP this morning. No complaints. Denies any current abdominal pain. Denies n/v. Asking for something to eat.  Objective: Vital signs in last 24 hours: Temp:  [98.3 F (36.8 C)-99.5 F (37.5 C)] 98.5 F (36.9 C) (04/06 0900) Pulse Rate:  [55-94] 64 (04/06 0900) Resp:  [14-19] 18 (04/06 0900) BP: (108-159)/(63-96) 134/88 (04/06 0900) SpO2:  [96 %-100 %] 97 % (04/06 0900) Weight:  [255 lb (115.7 kg)-300 lb (136.1 kg)] 255 lb (115.7 kg) (04/06 0710) Last BM Date: 06/23/17  Intake/Output from previous day: 04/05 0701 - 04/06 0700 In: 3126 [P.O.:240; I.V.:1836; IV Piggyback:1050] Out: -  Intake/Output this shift: Total I/O In: 400 [I.V.:400] Out: 0   PE: Gen:  Alert, NAD, pleasant HEENT: EOM's intact, pupils equal and round Card:  RRR, no M/G/R heard Pulm:  CTAB, no W/R/R, effort normal Abd: obese, soft, NT/ND, +BS, no HSM, no hernia Ext:  Calves soft and nontender Psych: A&Ox3  Skin: no rashes noted, warm and dry  Lab Results:  Recent Labs    06/23/17 0045 06/24/17 0516  WBC 5.3 6.6  HGB 14.2 14.2  HCT 42.7 44.0  PLT 296 292   BMET Recent Labs    06/23/17 0045 06/24/17 0516  NA 139 138  K 3.4* 3.7  CL 105 103  CO2 23 20*  GLUCOSE 107* 125*  BUN 7 <5*  CREATININE 1.27* 1.06  CALCIUM 9.0 9.5   PT/INR Recent Labs    06/24/17 0516  LABPROT 13.4  INR 1.03   CMP     Component Value Date/Time   NA 138 06/24/2017 0516   K 3.7 06/24/2017 0516   CL 103 06/24/2017 0516   CO2 20 (L) 06/24/2017 0516   GLUCOSE 125 (H) 06/24/2017 0516   BUN <5 (L) 06/24/2017 0516   CREATININE 1.06 06/24/2017 0516   CALCIUM 9.5 06/24/2017 0516   PROT 8.7 (H) 06/24/2017 0516   ALBUMIN 3.9 06/24/2017 0516   AST 384 (H) 06/24/2017 0516   ALT 808 (H) 06/24/2017 0516   ALKPHOS 228 (H) 06/24/2017 0516   BILITOT 7.2 (H) 06/24/2017 0516   GFRNONAA >60  06/24/2017 0516   GFRAA >60 06/24/2017 0516   Lipase     Component Value Date/Time   LIPASE 31 06/23/2017 0045       Studies/Results: Dg Abdomen 1 View  Result Date: 06/23/2017 CLINICAL DATA:  Generalized abdominal pain for 1 week. No bowel movement for a few days. Right upper quadrant pain. EXAM: ABDOMEN - 1 VIEW COMPARISON:  None. FINDINGS: Gas and stool throughout the colon. No small or large bowel distention. No radiopaque stones. Visualized bones appear intact. Soft tissue contours are normal. IMPRESSION: Normal nonobstructive bowel gas pattern with stool-filled colon. Electronically Signed   By: Burman NievesWilliam  Stevens M.D.   On: 06/23/2017 02:18   Mr Abdomen Mrcp Wo Contrast  Result Date: 06/23/2017 CLINICAL DATA:  Abnormal liver function tests.  Cholelithiasis EXAM: MRI ABDOMEN WITHOUT CONTRAST  (INCLUDING MRCP) TECHNIQUE: Multiplanar multisequence MR imaging of the abdomen was performed. Heavily T2-weighted images of the biliary and pancreatic ducts were obtained, and three-dimensional MRCP images were rendered by post processing. No contrast was ordered with scattered that of note cellules ordered without IV contrast; however, patient became extremely nauseous and projectile vomiting during the scan. Therefore IV contrast cannot be given. COMPARISON:  None. FINDINGS: Lower chest:  Lung bases are clear. Hepatobiliary:  No focal hepatic lesion. No intrahepatic duct dilatation. The common bile duct has multiple several small filling defects which are rounded and measure approximately 3 mm each (image 11/100001, coronal thin MRCP) The gallbladder has multiple small gallstones floating within the fundus (image 17 of coronal MRCP series). The small stones measure 4 to a 6 mm and number approximately 50. Stones also are present in the neck of the gallbladder (image 14/10001. Pancreas: Normal pancreatic parenchymal intensity. No ductal dilatation or inflammation. Spleen: Normal spleen. Adrenals/urinary  tract: Adrenal glands and kidneys are normal. Stomach/Bowel: Stomach and limited of the small bowel is unremarkable Vascular/Lymphatic: Abdominal aortic normal caliber. No retroperitoneal periportal lymphadenopathy. Musculoskeletal: No aggressive osseous lesion IMPRESSION: 1. Choledocholithiasis with elevated bilirubin is concern for biliary obstruction. 2. Multiple small gallstones floating within the fundus and neck the gallbladder. 3. No intrahepatic duct dilatation. 4. No hepatic parenchymal abnormality on this noncontrast exam. These results will be called to the ordering clinician or representative by the Radiologist Assistant, and communication documented in the PACS or zVision Dashboard. Electronically Signed   By: Genevive Bi M.D.   On: 06/23/2017 16:47   Mr 3d Recon At Scanner  Result Date: 06/23/2017 CLINICAL DATA:  Abnormal liver function tests.  Cholelithiasis EXAM: MRI ABDOMEN WITHOUT CONTRAST  (INCLUDING MRCP) TECHNIQUE: Multiplanar multisequence MR imaging of the abdomen was performed. Heavily T2-weighted images of the biliary and pancreatic ducts were obtained, and three-dimensional MRCP images were rendered by post processing. No contrast was ordered with scattered that of note cellules ordered without IV contrast; however, patient became extremely nauseous and projectile vomiting during the scan. Therefore IV contrast cannot be given. COMPARISON:  None. FINDINGS: Lower chest:  Lung bases are clear. Hepatobiliary: No focal hepatic lesion. No intrahepatic duct dilatation. The common bile duct has multiple several small filling defects which are rounded and measure approximately 3 mm each (image 11/100001, coronal thin MRCP) The gallbladder has multiple small gallstones floating within the fundus (image 17 of coronal MRCP series). The small stones measure 4 to a 6 mm and number approximately 50. Stones also are present in the neck of the gallbladder (image 14/10001. Pancreas: Normal  pancreatic parenchymal intensity. No ductal dilatation or inflammation. Spleen: Normal spleen. Adrenals/urinary tract: Adrenal glands and kidneys are normal. Stomach/Bowel: Stomach and limited of the small bowel is unremarkable Vascular/Lymphatic: Abdominal aortic normal caliber. No retroperitoneal periportal lymphadenopathy. Musculoskeletal: No aggressive osseous lesion IMPRESSION: 1. Choledocholithiasis with elevated bilirubin is concern for biliary obstruction. 2. Multiple small gallstones floating within the fundus and neck the gallbladder. 3. No intrahepatic duct dilatation. 4. No hepatic parenchymal abnormality on this noncontrast exam. These results will be called to the ordering clinician or representative by the Radiologist Assistant, and communication documented in the PACS or zVision Dashboard. Electronically Signed   By: Genevive Bi M.D.   On: 06/23/2017 16:47   Dg Ercp Biliary & Pancreatic Ducts  Result Date: 06/24/2017 CLINICAL DATA:  27 year old male with choledocholithiasis EXAM: ERCP TECHNIQUE: Multiple spot images obtained with the fluoroscopic device and submitted for interpretation post-procedure. FLUOROSCOPY TIME:  Fluoroscopy Time:  2 minutes 12 seconds COMPARISON:  MRCP 06/23/2017 FINDINGS: Four intraoperative saved images obtained during ERCP and submitted for review. The images demonstrate a flexible endoscope in the descending duodenum with wire cannulation of the common duct. Cholangiogram demonstrates mild dilatation of the common duct. There are 2 faceted filling defects in the distal common duct consistent with choledocholithiasis. The subsequent images demonstrate sphincterotomy and balloon sweep of the common  duct. IMPRESSION: 1. Choledocholithiasis. 2. ERCP with sphincterotomy and balloon sweep of the common duct. These images were submitted for radiologic interpretation only. Please see the procedural report for the amount of contrast and the fluoroscopy time utilized.  Electronically Signed   By: Malachy Moan M.D.   On: 06/24/2017 08:29   US Abdomen Limited Ruq  Result Date: 06/23/2017 CLINICAL DATA:  Right upper quadrant pain and nausea EXAM: ULTRASOUND ABDOMEN LIMITED RIGHT UPPER QUADRANT COMPARISON:  None. FINDINGS: Gallbladder: There are numerous stones within the gallbladder. No gallbladder wall thickening nor pericholecystic fluid. There is also sludge within the gallbladder. Negative sonographic Murphy sign. Common bile duct: Diameter: 5.6 mm Liver: Heterogeneous hepatic echotexture without discrete mass. Portal vein is patent on color Doppler imaging with normal direction of blood flow towards the liver. IMPRESSION: Cholelithiasis without other evidence of acute cholecystitis. Electronically Signed   By: Deatra Robinson M.D.   On: 06/23/2017 06:05    Anti-infectives: Anti-infectives (From admission, onward)   None       Assessment/Plan GERD - try protonix  Asthma Obesity  RUQ pain Choledocholithiasis - s/p ERCP 4/6 by Dr. Elnoria Howard with sphincterotomy and balloon extraction  ID - none FEN - heart healthy diet, NPO after MN VTE - SCDs Foley - none Follow up - TBD  Plan - Patient would like to discuss having surgery now or as outpatient with his mother. Ok to eat today, NPO after midnight in case we proceed with surgery tomorrow. CBC/CMP in AM.   LOS: 1 day    Franne Forts , [facility] 06/24/2017, 12:45 PM Pager: [phone] Consults: [phone] Mon-Fri 7:00 am-4:30 pm Sat-Sun 7:00 am-11:30 am

## 2017-06-24 NOTE — Anesthesia Postprocedure Evaluation (Signed)
Anesthesia Post Note  Patient: Kevin Bauer  Procedure(s) Performed: ENDOSCOPIC RETROGRADE CHOLANGIOPANCREATOGRAPHY (ERCP) (N/A )     Patient location during evaluation: PACU Anesthesia Type: General Level of consciousness: awake and alert Pain management: pain level controlled Vital Signs Assessment: post-procedure vital signs reviewed and stable Respiratory status: spontaneous breathing, nonlabored ventilation, respiratory function stable and patient connected to nasal cannula oxygen Cardiovascular status: blood pressure returned to baseline and stable Postop Assessment: no apparent nausea or vomiting Anesthetic complications: no    Last Vitals:  Vitals:   06/24/17 0830 06/24/17 0900  BP: 125/74 134/88  Pulse: 94 64  Resp: 19 18  Temp:  36.9 C  SpO2: 97% 97%    Last Pain:  Vitals:   06/24/17 1007  TempSrc:   PainSc: 0-No pain                 Taijon Vink

## 2017-06-24 NOTE — Op Note (Signed)
[facility] Patient Name: Kevin Bauer Procedure Date : 06/24/2017 MRN: [id number] Attending MD: Jeani HawkingPatrick Sadiyah Kangas , MD Date of Birth: 07/04/1990 CSN: [id number] Age: [age] Admit Type: Inpatient Procedure:                ERCP Indications:              Common bile duct stone(s) Providers:                Jeani HawkingPatrick Balinda Heacock, MD, Leandrew KoyanagiStacey Duininck, RN, Norman ClayLisa Nunn,                            RN, Arlee Muslimhris Chandler Tech., Technician, Coralee Rudobert                            Flores, CRNA Referring MD:              Medicines:                General Anesthesia Complications:            No immediate complications. Estimated Blood Loss:     Estimated blood loss: none. Procedure:                Pre-Anesthesia Assessment:                           - Prior to the procedure, a History and Physical                            was performed, and patient medications and                            allergies were reviewed. The patient's tolerance of                            previous anesthesia was also reviewed. The risks                            and benefits of the procedure and the sedation                            options and risks were discussed with the patient.                            All questions were answered, and informed consent                            was obtained. Prior Anticoagulants: The patient has                            taken no previous anticoagulant or antiplatelet                            agents. ASA Grade Assessment: II - A patient with                            mild  systemic disease. After reviewing the risks                            and benefits, the patient was deemed in                            satisfactory condition to undergo the procedure.                           - Sedation was administered by an anesthesia                            professional. General anesthesia was attained.                           After obtaining informed consent, the scope was              passed under direct vision. Throughout the                            procedure, the patient's blood pressure, pulse, and                            oxygen saturations were monitored continuously. The                            [id number] scope was introduced through the                            mouth, and used to inject contrast into and used to                            inject contrast into the bile duct. The ERCP was                            somewhat difficult. The patient tolerated the                            procedure well. Scope In: Scope Out: Findings:      The major papilla was normal. A 0.035 inch straight standard wire was       passed into the biliary tree. A 9 mm biliary sphincterotomy was made       with a traction (standard) sphincterotome using ERBE electrocautery.       There was no post-sphincterotomy bleeding. The biliary tree was swept       with a 12 mm balloon starting at the bifurcation. Mucus was swept from       the duct. Pus was swept from the duct. Sludge was swept from the duct.       All stones were removed. Nothing was found.      The patient had a J-shaped stomach and this resulted in recannulation of       the CBD 3-4 times as scope position was not stable. Cannulation of the       CBD was performed with ease and achieved during the first  attempt. No       cannulation of the PD occurred. The CBD was dilated at approximately 12       mm. There was no evidence of any overt stones with opacification of the       CBD. An 8-9 mm sphincterotomy was created, but this was a difficult       maneuver with the positioning of the sphincterotome. The sphinctertome       was pointed more towards the pancreatic position. Care was taken to move       the sphincterotomy towards the 11-12 o'clock position, which was       difficult to hold. No further attempts to extend the sphincterotomy was       performed. Sweeping the duct, there was evidence of  some pus and small       stones, however, there was a copious amount of mucus. No stones were       retained with the occlusion cholangiogram and there was good drainage of       bile and debris after removal of the balloon and wire. Impression:               - The major papilla appeared normal.                           - Choledocholithiasis was found. Complete removal                            was accomplished by biliary sphincterotomy and                            balloon extraction.                           - A biliary sphincterotomy was performed.                           - The biliary tree was swept and mucus, pus and                            nothing were found. Recommendation:           - Return patient to hospital ward for ongoing care.                           - Lap chole per Surgery. Procedure Code(s):        --- Professional ---                           [id number], Endoscopic retrograde                            cholangiopancreatography (ERCP); with removal of                            calculi/debris from biliary/pancreatic duct(s)                           43262, Endoscopic retrograde  cholangiopancreatography (ERCP); with                            sphincterotomy/papillotomy Diagnosis Code(s):        --- Professional ---                           K80.50, Calculus of bile duct without cholangitis                            or cholecystitis without obstruction CPT copyright 2017 [employer]. All rights reserved. The codes documented in this report are preliminary and upon coder review may  be revised to meet current compliance requirements. Jeani Hawking, MD Jeani Hawking, MD 06/24/2017 8:21:56 AM This report has been signed electronically. Number of Addenda: 0

## 2017-06-24 NOTE — Anesthesia Procedure Notes (Signed)
Procedure Name: Intubation Date/Time: 06/24/2017 7:32 AM Performed by: Coralee RudFlores, Reina Wilton, CRNA Pre-anesthesia Checklist: Emergency Drugs available, Patient identified, Suction available and Patient being monitored Patient Re-evaluated:Patient Re-evaluated prior to induction Oxygen Delivery Method: Circle system utilized Preoxygenation: Pre-oxygenation with 100% oxygen Induction Type: IV induction Ventilation: Mask ventilation without difficulty Laryngoscope Size: Miller and 3 Grade View: Grade I Tube type: Oral Tube size: 8.0 mm Number of attempts: 1 Airway Equipment and Method: Stylet Placement Confirmation: ETT inserted through vocal cords under direct vision,  positive ETCO2 and breath sounds checked- equal and bilateral Secured at: 23 cm Tube secured with: Tape Dental Injury: Teeth and Oropharynx as per pre-operative assessment

## 2017-06-24 NOTE — Transfer of Care (Signed)
Immediate Anesthesia Transfer of Care Note  Patient: Murlean Harklbert Raske  Procedure(s) Performed: ENDOSCOPIC RETROGRADE CHOLANGIOPANCREATOGRAPHY (ERCP) (N/A )  Patient Location: PACU  Anesthesia Type:General  Level of Consciousness: awake, sedated and patient cooperative  Airway & Oxygen Therapy: Patient Spontanous Breathing  Post-op Assessment: Report given to RN, Post -op Vital signs reviewed and stable and Patient moving all extremities  Post vital signs: Reviewed and stable  Last Vitals:  Vitals Value Taken Time  BP 134/64 06/24/2017  8:24 AM  Temp 36.9 C 06/24/2017  8:24 AM  Pulse 85 06/24/2017  8:26 AM  Resp 17 06/24/2017  8:26 AM  SpO2 98 % 06/24/2017  8:26 AM  Vitals shown include unvalidated device data.  Last Pain:  Vitals:   06/24/17 0824  TempSrc: Oral  PainSc: 0-No pain         Complications: No apparent anesthesia complications

## 2017-06-24 NOTE — Progress Notes (Signed)
PROGRESS NOTE  Kevin Harklbert Harwick [id number] DOB: 08/24/1990 DOA: 06/23/2017 PCP: Daiva NakayamaPa, [facility]  HPI/Recap of past 24 hours: Kevin Bauer is a 27 y.o. male  Past medical history significant for reflux and obesity presents with right upper quadrant pain.  Patient states the pain started approximately 1 week ago.  No history of gallbladder issues.  Denies any trauma to the abdomen.  Patient states that the pain became so intense that at times he will vomit secondary to due to pain.  Patient's oral intake has decreased due to this.  Patient denies eating a fatty diet.  06/24/17: No new complaints. Denies nausea. States has acid reflux. Given GI cocktail w improvement of symptoms. Continues on protonix po daily. POD #0 post ERCP by Dr Elnoria HowardHung. Gen surgery following for cholelithiasis.   Assessment/Plan: Principal Problem:   Elevated liver enzymes Active Problems:   Esophageal reflux   Asthma  Intractable abdominal pain in the setting of transaminitis MRCP without contrast revealed related to cholelithiasis with elevated bilirubin with concern for biliary obstruction Multiple small gallstone floating within the fundus and neck of the gallbladder with no intrahepatic duct dilatation no hepatic parenchymal abnormality Status post ERCP on 06/24/2017 with removal of common bile duct stones Surgery following for possible lap cholecystectomy N.p.o. after midnight GI cocktail for symptomatic treatment Continue Protonix p.o. 40 mg daily  Acute transaminitis Improving Repeat levels in the morning N.p.o. after midnight for possible lap cholecystectomy in the morning General surgery following  AK I with no history of CKD Baseline creatinine 0.9 Creatinine 1.27 on admission Today creatinine is 1.06 Avoid nephrotoxic agent/hypotension/dehydration Repeat chemistry panel in the morning  Obesity BMI 34 Weight loss outpatient   Code Status: Full code  Family Communication: Mother at  bedside  Disposition Plan: Home when clinically stable   Consultants:  GI  General surgery  Procedures:  ERCP 06/24/2017  Sphincterotomy 06/24/2017  Antimicrobials:  None  DVT prophylaxis: SCDs   Objective: Vitals:   06/24/17 0710 06/24/17 0824 06/24/17 0830 06/24/17 0900  BP: (!) 159/96 134/64 125/74 134/88  Pulse: 69 90 94 64  Resp: 14 16 19 18   Temp: 99.5 F (37.5 C) 98.4 F (36.9 C)  98.5 F (36.9 C)  TempSrc: Oral Oral  Oral  SpO2: 100% 96% 97% 97%  Weight: 115.7 kg (255 lb)     Height: 6' (1.829 m)       Intake/Output Summary (Last 24 hours) at 06/24/2017 1635 Last data filed at 06/24/2017 [id number] Gross per 24 hour  Intake 2476.01 ml  Output 0 ml  Net 2476.01 ml   Filed Weights   06/23/17 0049 06/24/17 0600 06/24/17 0710  Weight: 136.1 kg (300 lb) 136.1 kg (300 lb) 115.7 kg (255 lb)    Exam:   General: 27 year old African-American male well-developed well-nourished no acute distress.  Alert and oriented x3.  Cardiovascular: Regular rate and rhythm with no rubs or gallops.  No JVD or thyromegaly.  Respiratory: Clear to auscultation with no wheezes or rales.  Abdomen: Soft nontender nondistended normal bowel sounds x4  Musculoskeletal: No focal deficits  Skin: No ulcerative lesions  Psychiatry: Mood is appropriate for condition and setting.   Data Reviewed: CBC: Recent Labs  Lab 06/23/17 0045 06/24/17 0516  WBC 5.3 6.6  HGB 14.2 14.2  HCT 42.7 44.0  MCV 83.2 84.3  PLT 296 292   Basic Metabolic Panel: Recent Labs  Lab 06/23/17 0045 06/24/17 0516  NA 139 138  K 3.4* 3.7  CL 105 103  CO2 23 20*  GLUCOSE 107* 125*  BUN 7 <5*  CREATININE 1.27* 1.06  CALCIUM 9.0 9.5   GFR: Estimated Creatinine Clearance: 139.8 mL/min (by C-G formula based on SCr of 1.06 mg/dL). Liver Function Tests: Recent Labs  Lab 06/23/17 0045 06/24/17 0516  AST 522* 384*  ALT 811* 808*  ALKPHOS 179* 228*  BILITOT 5.5* 7.2*  PROT 7.8 8.7*  ALBUMIN 3.7  3.9   Recent Labs  Lab 06/23/17 0045  LIPASE 31   No results for input(s): AMMONIA in the last 168 hours. Coagulation Profile: Recent Labs  Lab 06/24/17 0516  INR 1.03   Cardiac Enzymes: No results for input(s): CKTOTAL, CKMB, CKMBINDEX, TROPONINI in the last 168 hours. BNP (last 3 results) No results for input(s): PROBNP in the last 8760 hours. HbA1C: No results for input(s): HGBA1C in the last 72 hours. CBG: No results for input(s): GLUCAP in the last 168 hours. Lipid Profile: No results for input(s): CHOL, HDL, LDLCALC, TRIG, CHOLHDL, LDLDIRECT in the last 72 hours. Thyroid Function Tests: No results for input(s): TSH, T4TOTAL, FREET4, T3FREE, THYROIDAB in the last 72 hours. Anemia Panel: No results for input(s): VITAMINB12, FOLATE, FERRITIN, TIBC, IRON, RETICCTPCT in the last 72 hours. Urine analysis:    Component Value Date/Time   COLORURINE AMBER (A) 06/23/2017 0044   APPEARANCEUR CLEAR 06/23/2017 0044   LABSPEC 1.023 06/23/2017 0044   PHURINE 5.0 06/23/2017 0044   GLUCOSEU NEGATIVE 06/23/2017 0044   HGBUR NEGATIVE 06/23/2017 0044   BILIRUBINUR MODERATE (A) 06/23/2017 0044   KETONESUR NEGATIVE 06/23/2017 0044   PROTEINUR NEGATIVE 06/23/2017 0044   UROBILINOGEN 1.0 01/31/2012 1145   NITRITE NEGATIVE 06/23/2017 0044   LEUKOCYTESUR NEGATIVE 06/23/2017 0044   Sepsis Labs: @LABRCNTIP (procalcitonin:4,lacticidven:4)  )No results found for this or any previous visit (from the past 240 hour(s)).    Studies: Dg Ercp Biliary & Pancreatic Ducts  Result Date: 06/24/2017 CLINICAL DATA:  27 year old male with choledocholithiasis EXAM: ERCP TECHNIQUE: Multiple spot images obtained with the fluoroscopic device and submitted for interpretation post-procedure. FLUOROSCOPY TIME:  Fluoroscopy Time:  2 minutes 12 seconds COMPARISON:  MRCP 06/23/2017 FINDINGS: Four intraoperative saved images obtained during ERCP and submitted for review. The images demonstrate a flexible  endoscope in the descending duodenum with wire cannulation of the common duct. Cholangiogram demonstrates mild dilatation of the common duct. There are 2 faceted filling defects in the distal common duct consistent with choledocholithiasis. The subsequent images demonstrate sphincterotomy and balloon sweep of the common duct. IMPRESSION: 1. Choledocholithiasis. 2. ERCP with sphincterotomy and balloon sweep of the common duct. These images were submitted for radiologic interpretation only. Please see the procedural report for the amount of contrast and the fluoroscopy time utilized. Electronically Signed   By: Malachy Moan M.D.   On: 06/24/2017 08:29    Scheduled Meds: . pantoprazole  40 mg Oral Q0600    Continuous Infusions:   LOS: 1 day     Darlin Drop, MD [facility] Pager [phone]  If 7PM-7AM, please contact night-coverage [email or web address] Password [facility] 06/24/2017, 4:35 PM

## 2017-06-25 ENCOUNTER — Encounter (HOSPITAL_COMMUNITY): Payer: Self-pay | Admitting: Gastroenterology

## 2017-06-25 ENCOUNTER — Inpatient Hospital Stay (HOSPITAL_COMMUNITY): Payer: Medicaid Other | Admitting: Anesthesiology

## 2017-06-25 ENCOUNTER — Other Ambulatory Visit: Payer: Self-pay

## 2017-06-25 ENCOUNTER — Encounter (HOSPITAL_COMMUNITY)
Admission: EM | Disposition: A | Discharge status: Home / Self Care | Payer: Self-pay | Source: Home / Self Care | Attending: Internal Medicine

## 2017-06-25 DIAGNOSIS — J45909 Unspecified asthma, uncomplicated: Secondary | ICD-10-CM

## 2017-06-25 HISTORY — PX: CHOLECYSTECTOMY: SHX55

## 2017-06-25 LAB — CBC
HCT: 37.4 % — ABNORMAL LOW (ref 39.0–52.0)
Hemoglobin: 12.5 g/dL — ABNORMAL LOW (ref 13.0–17.0)
MCH: 27.8 pg (ref 26.0–34.0)
MCHC: 33.4 g/dL (ref 30.0–36.0)
MCV: 83.1 fL (ref 78.0–100.0)
PLATELETS: 282 10*3/uL (ref 150–400)
RBC: 4.5 MIL/uL (ref 4.22–5.81)
RDW: 14.6 % (ref 11.5–15.5)
WBC: 9.8 10*3/uL (ref 4.0–10.5)

## 2017-06-25 LAB — COMPREHENSIVE METABOLIC PANEL
ALBUMIN: 3 g/dL — AB (ref 3.5–5.0)
ALT: 597 U/L — AB (ref 17–63)
AST: 264 U/L — ABNORMAL HIGH (ref 15–41)
Alkaline Phosphatase: 209 U/L — ABNORMAL HIGH (ref 38–126)
Anion gap: 11 (ref 5–15)
BUN: 6 mg/dL (ref 6–20)
CHLORIDE: 105 mmol/L (ref 101–111)
CO2: 23 mmol/L (ref 22–32)
CREATININE: 1.14 mg/dL (ref 0.61–1.24)
Calcium: 8.5 mg/dL — ABNORMAL LOW (ref 8.9–10.3)
GFR calc non Af Amer: >60 mL/min (ref >60)
GLUCOSE: 107 mg/dL — AB (ref 65–99)
Potassium: 3 mmol/L — ABNORMAL LOW (ref 3.5–5.1)
SODIUM: 139 mmol/L (ref 135–145)
Total Bilirubin: 6.5 mg/dL — ABNORMAL HIGH (ref 0.3–1.2)
Total Protein: 7 g/dL (ref 6.5–8.1)

## 2017-06-25 LAB — MRSA PCR SCREENING: MRSA by PCR: NEGATIVE

## 2017-06-25 SURGERY — LAPAROSCOPIC CHOLECYSTECTOMY
Anesthesia: General | Site: Abdomen

## 2017-06-25 MED ORDER — SODIUM CHLORIDE 0.9 % IR SOLN
Status: DC | PRN
  Administered 2017-06-25: 1000 mL

## 2017-06-25 MED ORDER — BUPIVACAINE-EPINEPHRINE 0.25% -1:200000 IJ SOLN
INTRAMUSCULAR | Status: DC | PRN
  Administered 2017-06-25: 10 mL

## 2017-06-25 MED ORDER — HYDROMORPHONE HCL 1 MG/ML IJ SOLN: 0.2500 mg | INTRAMUSCULAR | Status: DC | PRN

## 2017-06-25 MED ORDER — PROPOFOL 10 MG/ML IV BOLUS
INTRAVENOUS | Status: AC
  Filled 2017-06-25: qty 20

## 2017-06-25 MED ORDER — FENTANYL CITRATE (PF) 250 MCG/5ML IJ SOLN
INTRAMUSCULAR | Status: AC
  Filled 2017-06-25: qty 5

## 2017-06-25 MED ORDER — 0.9 % SODIUM CHLORIDE (POUR BTL) OPTIME
TOPICAL | Status: DC | PRN
  Administered 2017-06-25: 1000 mL

## 2017-06-25 MED ORDER — HEMOSTATIC AGENTS (NO CHARGE) OPTIME
TOPICAL | Status: DC | PRN
  Administered 2017-06-25: 1 via TOPICAL

## 2017-06-25 MED ORDER — LIDOCAINE 2% (20 MG/ML) 5 ML SYRINGE
INTRAMUSCULAR | Status: AC
  Filled 2017-06-25: qty 5

## 2017-06-25 MED ORDER — BUPIVACAINE-EPINEPHRINE (PF) 0.25% -1:200000 IJ SOLN
INTRAMUSCULAR | Status: AC
  Filled 2017-06-25: qty 30

## 2017-06-25 MED ORDER — ONDANSETRON HCL 4 MG/2ML IJ SOLN
INTRAMUSCULAR | Status: AC
  Filled 2017-06-25: qty 2

## 2017-06-25 MED ORDER — POTASSIUM CHLORIDE 10 MEQ/100ML IV SOLN
10.0000 meq | INTRAVENOUS | Status: AC
  Administered 2017-06-25 (×4): 10 meq via INTRAVENOUS
  Filled 2017-06-25 (×4): qty 100

## 2017-06-25 MED ORDER — HYDROMORPHONE HCL 1 MG/ML IJ SOLN
1.0000 mg | INTRAMUSCULAR | Status: DC | PRN
  Administered 2017-06-25: 1 mg via INTRAVENOUS
  Filled 2017-06-25: qty 1

## 2017-06-25 MED ORDER — FENTANYL CITRATE (PF) 250 MCG/5ML IJ SOLN
INTRAMUSCULAR | Status: DC | PRN
  Administered 2017-06-25 (×2): 50 ug via INTRAVENOUS
  Administered 2017-06-25: 100 ug via INTRAVENOUS

## 2017-06-25 MED ORDER — EPHEDRINE 5 MG/ML INJ
INTRAVENOUS | Status: AC
  Filled 2017-06-25: qty 10

## 2017-06-25 MED ORDER — SUCCINYLCHOLINE CHLORIDE 200 MG/10ML IV SOSY
PREFILLED_SYRINGE | INTRAVENOUS | Status: AC
  Filled 2017-06-25: qty 10

## 2017-06-25 MED ORDER — ROCURONIUM BROMIDE 10 MG/ML (PF) SYRINGE
PREFILLED_SYRINGE | INTRAVENOUS | Status: AC
  Filled 2017-06-25: qty 5

## 2017-06-25 MED ORDER — DEXAMETHASONE SODIUM PHOSPHATE 10 MG/ML IJ SOLN
INTRAMUSCULAR | Status: DC | PRN
  Administered 2017-06-25: 10 mg via INTRAVENOUS

## 2017-06-25 MED ORDER — MAGNESIUM SULFATE 2 GM/50ML IV SOLN
2.0000 g | Freq: Once | INTRAVENOUS | Status: AC
  Administered 2017-06-25: 2 g via INTRAVENOUS
  Filled 2017-06-25: qty 50

## 2017-06-25 MED ORDER — MIDAZOLAM HCL 5 MG/5ML IJ SOLN
INTRAMUSCULAR | Status: DC | PRN
  Administered 2017-06-25: 2 mg via INTRAVENOUS

## 2017-06-25 MED ORDER — LACTATED RINGERS IV SOLN
INTRAVENOUS | Status: DC | PRN
  Administered 2017-06-25: 10:00:00 via INTRAVENOUS

## 2017-06-25 MED ORDER — MORPHINE SULFATE (PF) 2 MG/ML IV SOLN
2.0000 mg | Freq: Once | INTRAVENOUS | Status: AC
  Administered 2017-06-25: 2 mg via INTRAVENOUS
  Filled 2017-06-25: qty 1

## 2017-06-25 MED ORDER — SODIUM CHLORIDE 0.45 % IV SOLN: INTRAVENOUS | Status: DC

## 2017-06-25 MED ORDER — SODIUM CHLORIDE 0.9 % IV SOLN
2.0000 g | INTRAVENOUS | Status: AC
  Administered 2017-06-25: 2 g via INTRAVENOUS
  Filled 2017-06-25: qty 2

## 2017-06-25 MED ORDER — CEFOTETAN DISODIUM-DEXTROSE 2-2.08 GM-%(50ML) IV SOLR
INTRAVENOUS | Status: AC
  Filled 2017-06-25: qty 50

## 2017-06-25 MED ORDER — PROPOFOL 10 MG/ML IV BOLUS
INTRAVENOUS | Status: DC | PRN
  Administered 2017-06-25: 200 mg via INTRAVENOUS

## 2017-06-25 MED ORDER — SUGAMMADEX SODIUM 200 MG/2ML IV SOLN
INTRAVENOUS | Status: DC | PRN
  Administered 2017-06-25: 240 mg via INTRAVENOUS

## 2017-06-25 MED ORDER — ONDANSETRON HCL 4 MG/2ML IJ SOLN
INTRAMUSCULAR | Status: DC | PRN
  Administered 2017-06-25: 4 mg via INTRAVENOUS

## 2017-06-25 MED ORDER — DEXAMETHASONE SODIUM PHOSPHATE 10 MG/ML IJ SOLN
INTRAMUSCULAR | Status: AC
  Filled 2017-06-25: qty 1

## 2017-06-25 MED ORDER — PROMETHAZINE HCL 25 MG/ML IJ SOLN: 6.2500 mg | INTRAMUSCULAR | Status: DC | PRN

## 2017-06-25 MED ORDER — ROCURONIUM BROMIDE 10 MG/ML (PF) SYRINGE
PREFILLED_SYRINGE | INTRAVENOUS | Status: DC | PRN
  Administered 2017-06-25: 70 mg via INTRAVENOUS

## 2017-06-25 MED ORDER — LIDOCAINE 2% (20 MG/ML) 5 ML SYRINGE
INTRAMUSCULAR | Status: DC | PRN
  Administered 2017-06-25: 100 mg via INTRAVENOUS

## 2017-06-25 MED ORDER — MIDAZOLAM HCL 2 MG/2ML IJ SOLN
INTRAMUSCULAR | Status: AC
  Filled 2017-06-25: qty 2

## 2017-06-25 MED ORDER — SODIUM CHLORIDE 0.45 % IV SOLN
INTRAVENOUS | Status: DC
  Administered 2017-06-25: 09:00:00 via INTRAVENOUS

## 2017-06-25 MED ORDER — NALOXONE HCL 0.4 MG/ML IJ SOLN: 0.4000 mg | INTRAMUSCULAR | Status: DC | PRN

## 2017-06-25 MED ORDER — SUGAMMADEX SODIUM 500 MG/5ML IV SOLN
INTRAVENOUS | Status: AC
  Filled 2017-06-25: qty 5

## 2017-06-25 SURGICAL SUPPLY — 40 items
APPLIER CLIP 5 13 M/L LIGAMAX5 (MISCELLANEOUS) ×3
BLADE CLIPPER SURG (BLADE) IMPLANT
CANISTER SUCT 3000ML PPV (MISCELLANEOUS) ×3 IMPLANT
CHLORAPREP W/TINT 26ML (MISCELLANEOUS) ×3 IMPLANT
CLIP APPLIE 5 13 M/L LIGAMAX5 (MISCELLANEOUS) ×1 IMPLANT
CLOSURE WOUND 1/2 X4 (GAUZE/BANDAGES/DRESSINGS) ×1
COVER SURGICAL LIGHT HANDLE (MISCELLANEOUS) ×3 IMPLANT
DERMABOND ADHESIVE PROPEN (GAUZE/BANDAGES/DRESSINGS) ×2
DERMABOND ADVANCED (GAUZE/BANDAGES/DRESSINGS) ×2
DERMABOND ADVANCED .7 DNX12 (GAUZE/BANDAGES/DRESSINGS) ×1 IMPLANT
DERMABOND ADVANCED .7 DNX6 (GAUZE/BANDAGES/DRESSINGS) ×1 IMPLANT
DRSG TEGADERM 2-3/8X2-3/4 SM (GAUZE/BANDAGES/DRESSINGS) ×3 IMPLANT
ELECT REM PT RETURN 9FT ADLT (ELECTROSURGICAL) ×3
ELECTRODE REM PT RTRN 9FT ADLT (ELECTROSURGICAL) ×1 IMPLANT
GLOVE BIOGEL PI IND STRL 8 (GLOVE) ×1 IMPLANT
GLOVE BIOGEL PI INDICATOR 8 (GLOVE) ×2
GLOVE ECLIPSE 7.5 STRL STRAW (GLOVE) ×3 IMPLANT
GOWN STRL REUS W/ TWL LRG LVL3 (GOWN DISPOSABLE) ×3 IMPLANT
GOWN STRL REUS W/TWL LRG LVL3 (GOWN DISPOSABLE) ×6
HEMOSTAT SNOW SURGICEL 2X4 (HEMOSTASIS) ×3 IMPLANT
KIT BASIN OR (CUSTOM PROCEDURE TRAY) ×3 IMPLANT
KIT TURNOVER KIT B (KITS) ×3 IMPLANT
NS IRRIG 1000ML POUR BTL (IV SOLUTION) ×3 IMPLANT
PAD ARMBOARD 7.5X6 YLW CONV (MISCELLANEOUS) ×3 IMPLANT
POUCH RETRIEVAL ECOSAC 10 (ENDOMECHANICALS) IMPLANT
POUCH RETRIEVAL ECOSAC 10MM (ENDOMECHANICALS)
SCISSORS LAP 5X35 DISP (ENDOMECHANICALS) ×3 IMPLANT
SET IRRIG TUBING LAPAROSCOPIC (IRRIGATION / IRRIGATOR) ×3 IMPLANT
SLEEVE ENDOPATH XCEL 5M (ENDOMECHANICALS) ×6 IMPLANT
SPECIMEN JAR SMALL (MISCELLANEOUS) ×3 IMPLANT
STRIP CLOSURE SKIN 1/2X4 (GAUZE/BANDAGES/DRESSINGS) ×2 IMPLANT
SUT MNCRL AB 4-0 PS2 18 (SUTURE) ×3 IMPLANT
TAPE STRIPS DRAPE STRL (GAUZE/BANDAGES/DRESSINGS) ×3 IMPLANT
TOWEL OR 17X24 6PK STRL BLUE (TOWEL DISPOSABLE) ×3 IMPLANT
TOWEL OR 17X26 10 PK STRL BLUE (TOWEL DISPOSABLE) ×3 IMPLANT
TRAY LAPAROSCOPIC MC (CUSTOM PROCEDURE TRAY) ×3 IMPLANT
TROCAR XCEL BLUNT TIP 100MML (ENDOMECHANICALS) ×3 IMPLANT
TROCAR XCEL NON-BLD 5MMX100MML (ENDOMECHANICALS) ×3 IMPLANT
TUBING INSUFFLATION (TUBING) ×3 IMPLANT
WATER STERILE IRR 1000ML POUR (IV SOLUTION) ×3 IMPLANT

## 2017-06-25 NOTE — Discharge Instructions (Addendum)
Laparoscopic Cholecystectomy Laparoscopic cholecystectomy is surgery to remove the gallbladder. The gallbladder is a pear-shaped organ that lies beneath the liver on the right side of the body. The gallbladder stores bile, which is a fluid that helps the body to digest fats. Cholecystectomy is often done for inflammation of the gallbladder (cholecystitis). This condition is usually caused by a buildup of gallstones (cholelithiasis) in the gallbladder. Gallstones can block the flow of bile, which can result in inflammation and pain. In severe cases, emergency surgery may be required. This procedure is done though small incisions in your abdomen (laparoscopic surgery). A thin scope with a camera (laparoscope) is inserted through one incision. Thin surgical instruments are inserted through the other incisions. In some cases, a laparoscopic procedure may be turned into a type of surgery that is done through a larger incision (open surgery). Tell a health care provider about:  Any allergies you have.  All medicines you are taking, including vitamins, herbs, eye drops, creams, and over-the-counter medicines.  Any problems you or family members have had with anesthetic medicines.  Any blood disorders you have.  Any surgeries you have had.  Any medical conditions you have.  Whether you are pregnant or may be pregnant. What are the risks? Generally, this is a safe procedure. However, problems may occur, including:  Infection.  Bleeding.  Allergic reactions to medicines.  Damage to other structures or organs.  A stone remaining in the common bile duct. The common bile duct carries bile from the gallbladder into the small intestine.  A bile leak from the cyst duct that is clipped when your gallbladder is removed.  What happens before the procedure? Staying hydrated Follow instructions from your health care provider about hydration, which may include:  Up to 2 hours before the procedure  - you may continue to drink clear liquids, such as water, clear fruit juice, black coffee, and plain tea.  Eating and drinking restrictions Follow instructions from your health care provider about eating and drinking, which may include:  8 hours before the procedure - stop eating heavy meals or foods such as meat, fried foods, or fatty foods.  6 hours before the procedure - stop eating light meals or foods, such as toast or cereal.  6 hours before the procedure - stop drinking milk or drinks that contain milk.  2 hours before the procedure - stop drinking clear liquids.  Medicines  Ask your health care provider about: ? Changing or stopping your regular medicines. This is especially important if you are taking diabetes medicines or blood thinners. ? Taking medicines such as aspirin and ibuprofen. These medicines can thin your blood. Do not take these medicines before your procedure if your health care provider instructs you not to.  You may be given antibiotic medicine to help prevent infection. General instructions  Let your health care provider know if you develop a cold or an infection before surgery.  Plan to have someone take you home from the hospital or clinic.  Ask your health care provider how your surgical site will be marked or identified. What happens during the procedure?  To reduce your risk of infection: ? Your health care team will wash or sanitize their hands. ? Your skin will be washed with soap. ? Hair may be removed from the surgical area.  An IV tube may be inserted into one of your veins.  You will be given one or more of the following: ? A medicine to help you relax (  sedative). ? A medicine to make you fall asleep (general anesthetic).  A breathing tube will be placed in your mouth.  Your surgeon will make several small cuts (incisions) in your abdomen.  The laparoscope will be inserted through one of the small incisions. The camera on the  laparoscope will send images to a TV screen (monitor) in the operating room. This lets your surgeon see inside your abdomen.  Air-like gas will be pumped into your abdomen. This will expand your abdomen to give the surgeon more room to perform the surgery.  Other tools that are needed for the procedure will be inserted through the other incisions. The gallbladder will be removed through one of the incisions.  Your common bile duct may be examined. If stones are found in the common bile duct, they may be removed.  After your gallbladder has been removed, the incisions will be closed with stitches (sutures), staples, or skin glue.  Your incisions may be covered with a bandage (dressing). The procedure may vary among health care providers and hospitals. What happens after the procedure?  Your blood pressure, heart rate, breathing rate, and blood oxygen level will be monitored until the medicines you were given have worn off.  You will be given medicines as needed to control your pain.  Do not drive for 24 hours if you were given a sedative. This information is not intended to replace advice given to you by your health care provider. Make sure you discuss any questions you have with your health care provider. Document Released: 03/07/2005 Document Revised: 09/27/2015 Document Reviewed: 08/24/2015 Elsevier Interactive Patient Education  2018 [employer] PLEASE GET LABS CHECKED 2 DAYS PRIOR TO YOUR FOLLOW UP APPOINTMENT WITH [facility] (SURGERY OFFICE)  Endoscopic Retrograde Cholangiopancreatogram, Care After This sheet gives you information about how to care for yourself after your procedure. Your health care provider may also give you more specific instructions. If you have problems or questions, contact your health care provider. What can I expect after the procedure? After the procedure, it is common to have:  Soreness in your throat.  Nausea.  Bloating.  Dizziness.  Tiredness  (fatigue).  Follow these instructions at home:  Take over-the-counter and prescription medicines only as told by your health care provider.  Do not drive for 24 hours if you were given a medicine to help you relax (sedative) during your procedure. Have someone stay with you for 24 hours after the procedure.  Return to your normal activities as told by your health care provider. Ask your health care provider what activities are safe for you.  Return to eating what you normally do as soon as you feel well enough or as told by your health care provider.  Keep all follow-up visits as told by your health care provider. This is important. Contact a health care provider if:  You have pain in your abdomen that does not get better with medicine.  You develop signs of infection, such as: ? Chills. ? Feeling unwell. Get help right away if:  You have difficulty swallowing.  You have worsening pain in your throat, chest, or abdomen.  You vomit bright red blood or a substance that looks like coffee grounds.  You have bloody or very black stools.  You have a fever.  You have a sudden increase in swelling (bloating) in your abdomen. Summary  After the procedure, it is common to feel tired and to have some discomfort in your throat.  Contact your health care  provider if you have signs of infection--such as chills or feeling unwell--or if you have pain that does not improve with medicine.  Get help right away if you have trouble swallowing, worsening pain, bloody or black vomit, bloody or black stools, a fever, or increased swelling in your abdomen.  Keep all follow-up visits as told by your health care provider. This is important. This information is not intended to replace advice given to you by your health care provider. Make sure you discuss any questions you have with your health care provider. Document Released: 12/26/2012 Document Revised: 01/25/2016 Document Reviewed:  01/25/2016 Elsevier Interactive Patient Education  2017 [employer]   Please arrive at least 30 min before your appointment to complete your check in paperwork.  If you are unable to arrive 30 min prior to your appointment time we may have to cancel or reschedule you.  LAPAROSCOPIC SURGERY: POST OP INSTRUCTIONS  1. DIET: Follow a light bland diet the first 24 hours after arrival home, such as soup, liquids, crackers, etc. Be sure to include lots of fluids daily. Avoid fast food or heavy meals as your are more likely to get nauseated. Eat a low fat the next few days after surgery.  2. Take your usually prescribed home medications unless otherwise directed. 3. PAIN CONTROL:  1. Pain is best controlled by a usual combination of three different methods TOGETHER:  1. Ice/Heat 2. Over the counter pain medication 3. Prescription pain medication 2. Most patients will experience some swelling and bruising around the incisions. Ice packs or heating pads (30-60 minutes up to 6 times a day) will help. Use ice for the first few days to help decrease swelling and bruising, then switch to heat to help relax tight/sore spots and speed recovery. Some people prefer to use ice alone, heat alone, alternating between ice & heat. Experiment to what works for you. Swelling and bruising can take several weeks to resolve.  3. It is helpful to take an over-the-counter pain medication regularly for the first few weeks. Choose one of the following that works best for you:  1. Naproxen (Aleve, etc) Two 220mg  tabs twice a day 2. Ibuprofen (Advil, etc) Three 200mg  tabs four times a day (every meal & bedtime) 3. Acetaminophen (Tylenol, etc) 500-650mg  four times a day (every meal & bedtime) 4. A prescription for pain medication (such as oxycodone, hydrocodone, etc) should be given to you upon discharge. Take your pain medication as prescribed.  1. If you are having problems/concerns with the prescription medicine (does not  control pain, nausea, vomiting, rash, itching, etc), please call us [phone] to see if we need to switch you to a different pain medicine that will work better for you and/or control your side effect better. 2. If you need a refill on your pain medication, please contact your pharmacy. They will contact our office to request authorization. Prescriptions will not be filled after 5 pm or on week-ends. 4. Avoid getting constipated. Between the surgery and the pain medications, it is common to experience some constipation. Increasing fluid intake and taking a fiber supplement (such as Metamucil, Citrucel, FiberCon, MiraLax, etc) 1-2 times a day regularly will usually help prevent this problem from occurring. A mild laxative (prune juice, Milk of Magnesia, MiraLax, etc) should be taken according to package directions if there are no bowel movements after 48 hours.  5. Watch out for diarrhea. If you have many loose bowel movements, simplify your diet to bland foods & liquids  for a few days. Stop any stool softeners and decrease your fiber supplement. Switching to mild anti-diarrheal medications (Kayopectate, Pepto Bismol) can help. If this worsens or does not improve, please call us. 6. Wash / shower every day. You may shower over the dressings as they are waterproof. Continue to shower over incision(s) after the dressing is off. If there is glue over the incisions try not to pick it off, let it fall off naturally. 7. Remove your waterproof bandages 2 days after surgery. You may leave the incision open to air. You may replace a dressing/Band-Aid to cover the incision for comfort if you wish.  8. ACTIVITIES as tolerated:  1. You may resume regular (light) daily activities beginning the next day--such as daily self-care, walking, climbing stairs--gradually increasing activities as tolerated. If you can walk 30 minutes without difficulty, it is safe to try more intense activity such as jogging, treadmill,  bicycling, low-impact aerobics, swimming, etc. 2. Save the most intensive and strenuous activity for last such as sit-ups, heavy lifting, contact sports, etc Refrain from any heavy lifting or straining until you are off narcotics for pain control. For the first 2-3 weeks do not lift over 10-15lb.  3. DO NOT PUSH THROUGH PAIN. Let pain be your guide: If it hurts to do something, don't do it. Pain is your body warning you to avoid that activity for another week until the pain goes down. 4. You may drive when you are no longer taking prescription pain medication, you can comfortably wear a seatbelt, and you can safely maneuver your car and apply brakes. 5. You may have sexual intercourse when it is comfortable.  9. FOLLOW UP in our office  1. Please call [facility] at [phone] to set up an appointment to see your surgeon in the office for a follow-up appointment approximately 2-3 weeks after your surgery. 2. Make sure that you call for this appointment the day you arrive home to insure a convenient appointment time.      10. IF YOU HAVE DISABILITY OR FAMILY LEAVE FORMS, BRING THEM TO THE               OFFICE FOR PROCESSING.   WHEN TO CALL us [phone]:  1. Poor pain control 2. Reactions / problems with new medications (rash/itching, nausea, etc)  3. Fever over 101.5 F (38.5 C) 4. Inability to urinate 5. Nausea and/or vomiting 6. Worsening swelling or bruising 7. Continued bleeding from incision. 8. Increased pain, redness, or drainage from the incision  The clinic staff is available to answer your questions during regular business hours (8:30am-5pm). Please dont hesitate to call and ask to speak to one of our nurses for clinical concerns.  If you have a medical emergency, go to the nearest emergency room or call 911.  A surgeon from [facility] is always on call at the [facility], [place]  [address], [address], [place], [place] [address] ?  MAIN:  [phone] ? TOLL FREE: [phone] ?  FAX [phone]  [email or web address]

## 2017-06-25 NOTE — Anesthesia Postprocedure Evaluation (Signed)
Anesthesia Post Note  Patient: Kevin Bauer  Procedure(s) Performed: LAPAROSCOPIC CHOLECYSTECTOMY (N/A Abdomen)     Patient location during evaluation: PACU Anesthesia Type: General Level of consciousness: sedated Pain management: pain level controlled Vital Signs Assessment: post-procedure vital signs reviewed and stable Respiratory status: spontaneous breathing and respiratory function stable Cardiovascular status: stable Postop Assessment: no apparent nausea or vomiting Anesthetic complications: no    Last Vitals:  Vitals:   06/25/17 1311 06/25/17 1318  BP: 129/79   Pulse: 80 86  Resp: 17 19  Temp:  37 C  SpO2: 97% 98%    Last Pain:  Vitals:   06/25/17 1318  TempSrc:   PainSc: 0-No pain                 Amelia Burgard DANIEL

## 2017-06-25 NOTE — Op Note (Signed)
OPERATIVE REPORT  DATE OF OPERATION: 06/25/2017  PATIENT:  Kevin HarkAlbert Job  27 y.o. male  PRE-OPERATIVE DIAGNOSIS:  symptomatic cholelithiasis and choledocholitiasis  POST-OPERATIVE DIAGNOSIS:  symptomatic cholelithiasis, subacute cholecystitis, and choledocholitiasis  INDICATION(S) FOR OPERATION:  Gallstones , common bile duct stones, RUQ pain  FINDINGS:  Subacute cholecystitis  PROCEDURE:  Procedure(s): LAPAROSCOPIC CHOLECYSTECTOMY  SURGEON:  Surgeon(s): Jimmye NormanWyatt, Eleshia Wooley, MD  ASSISTANT: None  ANESTHESIA:   general  COMPLICATIONS:  None  EBL: <30 ml  BLOOD ADMINISTERED: none  DRAINS: none   SPECIMEN:  Source of Specimen:  Gallbladder and contents  COUNTS CORRECT:  YES  PROCEDURE DETAILS: The patient was taken to the operating room and placed on the table in the supine position.  After an adequate endotracheal anesthetic was administered, the patient was prepped with ChloroPrep, and then draped in the usual manner exposing the entire abdomen laterally, inferiorly and up  to the costal margins.  After a proper timeout was performed including identifying the patient and the procedure to be performed, a supraumbilical 1.5cm midline incision was made using a #15 blade.  This was taken down to the fascia which was then incised with a #15 blade.  The edges of the fascia were tented up with Kocher clamps as the preperitoneal space was penetrated with a Kelly clamp into the peritoneum.  Once this was done, a pursestring suture of 0 Vicryl was passed around the fascial opening.  This was subsequently used to secure the [facility] which was passed into the peritoneal cavity.  Once the [facility] was in place, carbon dioxide gas was insufflated into the peritoneal cavity up to a maximal intra-abdominal pressure of 15mm Hg.The laparoscope, with attached camera and light source, was passed into the peritoneal cavity to visualize the direct insertion of two right upper quadrant 5mm cannulas,  and a sup-xiphoid 5mm cannula.  Once all cannulas were in place, the dissection was begun.  The gallbladder was inflamed and edematous.  There was adherence of the medial wall to the cystic duct overlying what could be seen as an enlarge CBD.  There was a good critical window for dissecting out the cystic duct  Two ratcheted graspers were attached to the dome and infundibulum of the gallbladder and retracted towards the anterior abdominal wall and the right upper quadrant.  Using cautery attached to a dissecting forceps, the peritoneum overlaying the triangle of Chalot and the hepatoduodenal triangle was dissected away exposing the cystic duct and the cystic artery.  A critical window was developed between the CBD and the cystic duct The cystic artery was clipped proximally and distally then transected.  A clip was placed on the gallbladder side of the cystic duct, then the distal cystic duct was clipped three times then transected between the clips.  The gallbladder was then dissected out of the hepatic bed without event.  It was retrieved from the abdomen using an EcoPouch bag without event.  Once the gallbladder was removed, the bed was inspected for hemostasis.  Once excellent hemostasis was obtained all gas and fluids were aspirated from above the liver, then the cannulas were removed.  The supraumbilical incision was closed using the pursestring suture which was in place.  0.25% bupivicaine with epinephrine was injected at all sites.  All 10mm or greater cannula sites were close using a running subcuticular stitch of 4-0 Monocryl.  5.160mm cannula sites were closed with Dermabond only.Steri-Strips and Tagaderm were used to complete the dressings at all sites.  At this point  all needle, sponge, and instrument counts were correct.The patient was awakened from anesthesia and taken to the PACU in stable condition.  Marta Lamas. Gae Bon, MD, FACS [phone] [facility]  PATIENT DISPOSITION:  PACU - hemodynamically stable.   Jimmye Norman 4/7/201912:31 PM

## 2017-06-25 NOTE — Transfer of Care (Signed)
Immediate Anesthesia Transfer of Care Note  Patient: Kevin Bauer  Procedure(s) Performed: LAPAROSCOPIC CHOLECYSTECTOMY (N/A Abdomen)  Patient Location: PACU  Anesthesia Type:General  Level of Consciousness: awake, alert  and oriented  Airway & Oxygen Therapy: Patient Spontanous Breathing and Patient connected to nasal cannula oxygen  Post-op Assessment: Report given to RN and Post -op Vital signs reviewed and stable  Post vital signs: Reviewed and stable  Last Vitals:  Vitals Value Taken Time  BP 144/79 06/25/2017 12:41 PM  Temp    Pulse 94 06/25/2017 12:42 PM  Resp 20 06/25/2017 12:42 PM  SpO2 100 % 06/25/2017 12:42 PM  Vitals shown include unvalidated device data.  Last Pain:  Vitals:   06/25/17 0828  TempSrc:   PainSc: 6          Complications: No apparent anesthesia complications

## 2017-06-25 NOTE — Progress Notes (Addendum)
[facility] Progress Note  1 Day Post-Op  Subjective: CC- RUQ Patient states that after he ate dinner last night he started having RUQ pain again. Denies n/v. Pain feels like it did prior to ERCP. He and his mother discussed inpatient vs outpatient cholecystectomy and he wishes to proceed with surgery during this admission. Labs pending this AM.  Objective: Vital signs in last 24 hours: Temp:  [98.4 F (36.9 C)-99.7 F (37.6 C)] 99.7 F (37.6 C) (04/07 0420) Pulse Rate:  [64-94] 89 (04/07 0420) Resp:  [16-19] 18 (04/07 0420) BP: (112-134)/(64-88) 126/74 (04/07 0420) SpO2:  [96 %-100 %] 100 % (04/07 0420) Last BM Date: 06/23/17  Intake/Output from previous day: 04/06 0701 - 04/07 0700 In: 400 [I.V.:400] Out: 0  Intake/Output this shift: No intake/output data recorded.  PE: Gen:  Alert, NAD, pleasant HEENT: EOM's intact, pupils equal and round Card:  RRR, no M/G/R heard Pulm:  CTAB, no W/R/R, effort normal Abd: obese, soft, ND, +BS, no HSM, no hernia, +TTP RUQ without rebound Ext:  Calves soft and nontender Psych: A&Ox3  Skin: no rashes noted, warm and dry  Lab Results:  Recent Labs    06/23/17 0045 06/24/17 0516  WBC 5.3 6.6  HGB 14.2 14.2  HCT 42.7 44.0  PLT 296 292   BMET Recent Labs    06/23/17 0045 06/24/17 0516  NA 139 138  K 3.4* 3.7  CL 105 103  CO2 23 20*  GLUCOSE 107* 125*  BUN 7 <5*  CREATININE 1.27* 1.06  CALCIUM 9.0 9.5   PT/INR Recent Labs    06/24/17 0516  LABPROT 13.4  INR 1.03   CMP     Component Value Date/Time   NA 138 06/24/2017 0516   K 3.7 06/24/2017 0516   CL 103 06/24/2017 0516   CO2 20 (L) 06/24/2017 0516   GLUCOSE 125 (H) 06/24/2017 0516   BUN <5 (L) 06/24/2017 0516   CREATININE 1.06 06/24/2017 0516   CALCIUM 9.5 06/24/2017 0516   PROT 8.7 (H) 06/24/2017 0516   ALBUMIN 3.9 06/24/2017 0516   AST 384 (H) 06/24/2017 0516   ALT 808 (H) 06/24/2017 0516   ALKPHOS 228 (H) 06/24/2017 0516   BILITOT 7.2  (H) 06/24/2017 0516   GFRNONAA >60 06/24/2017 0516   GFRAA >60 06/24/2017 0516   Lipase     Component Value Date/Time   LIPASE 31 06/23/2017 0045       Studies/Results: Mr Abdomen Mrcp Wo Contrast  Result Date: 06/23/2017 CLINICAL DATA:  Abnormal liver function tests.  Cholelithiasis EXAM: MRI ABDOMEN WITHOUT CONTRAST  (INCLUDING MRCP) TECHNIQUE: Multiplanar multisequence MR imaging of the abdomen was performed. Heavily T2-weighted images of the biliary and pancreatic ducts were obtained, and three-dimensional MRCP images were rendered by post processing. No contrast was ordered with scattered that of note cellules ordered without IV contrast; however, patient became extremely nauseous and projectile vomiting during the scan. Therefore IV contrast cannot be given. COMPARISON:  None. FINDINGS: Lower chest:  Lung bases are clear. Hepatobiliary: No focal hepatic lesion. No intrahepatic duct dilatation. The common bile duct has multiple several small filling defects which are rounded and measure approximately 3 mm each (image 11/100001, coronal thin MRCP) The gallbladder has multiple small gallstones floating within the fundus (image 17 of coronal MRCP series). The small stones measure 4 to a 6 mm and number approximately 50. Stones also are present in the neck of the gallbladder (image 14/10001. Pancreas: Normal pancreatic parenchymal intensity. No ductal  dilatation or inflammation. Spleen: Normal spleen. Adrenals/urinary tract: Adrenal glands and kidneys are normal. Stomach/Bowel: Stomach and limited of the small bowel is unremarkable Vascular/Lymphatic: Abdominal aortic normal caliber. No retroperitoneal periportal lymphadenopathy. Musculoskeletal: No aggressive osseous lesion IMPRESSION: 1. Choledocholithiasis with elevated bilirubin is concern for biliary obstruction. 2. Multiple small gallstones floating within the fundus and neck the gallbladder. 3. No intrahepatic duct dilatation. 4. No hepatic  parenchymal abnormality on this noncontrast exam. These results will be called to the ordering clinician or representative by the Radiologist Assistant, and communication documented in the PACS or zVision Dashboard. Electronically Signed   By: Genevive BiStewart  Edmunds M.D.   On: 06/23/2017 16:47   Mr 3d Recon At Scanner  Result Date: 06/23/2017 CLINICAL DATA:  Abnormal liver function tests.  Cholelithiasis EXAM: MRI ABDOMEN WITHOUT CONTRAST  (INCLUDING MRCP) TECHNIQUE: Multiplanar multisequence MR imaging of the abdomen was performed. Heavily T2-weighted images of the biliary and pancreatic ducts were obtained, and three-dimensional MRCP images were rendered by post processing. No contrast was ordered with scattered that of note cellules ordered without IV contrast; however, patient became extremely nauseous and projectile vomiting during the scan. Therefore IV contrast cannot be given. COMPARISON:  None. FINDINGS: Lower chest:  Lung bases are clear. Hepatobiliary: No focal hepatic lesion. No intrahepatic duct dilatation. The common bile duct has multiple several small filling defects which are rounded and measure approximately 3 mm each (image 11/100001, coronal thin MRCP) The gallbladder has multiple small gallstones floating within the fundus (image 17 of coronal MRCP series). The small stones measure 4 to a 6 mm and number approximately 50. Stones also are present in the neck of the gallbladder (image 14/10001. Pancreas: Normal pancreatic parenchymal intensity. No ductal dilatation or inflammation. Spleen: Normal spleen. Adrenals/urinary tract: Adrenal glands and kidneys are normal. Stomach/Bowel: Stomach and limited of the small bowel is unremarkable Vascular/Lymphatic: Abdominal aortic normal caliber. No retroperitoneal periportal lymphadenopathy. Musculoskeletal: No aggressive osseous lesion IMPRESSION: 1. Choledocholithiasis with elevated bilirubin is concern for biliary obstruction. 2. Multiple small gallstones  floating within the fundus and neck the gallbladder. 3. No intrahepatic duct dilatation. 4. No hepatic parenchymal abnormality on this noncontrast exam. These results will be called to the ordering clinician or representative by the Radiologist Assistant, and communication documented in the PACS or zVision Dashboard. Electronically Signed   By: Genevive BiStewart  Edmunds M.D.   On: 06/23/2017 16:47   Dg Ercp Biliary & Pancreatic Ducts  Result Date: 06/24/2017 CLINICAL DATA:  27 year old male with choledocholithiasis EXAM: ERCP TECHNIQUE: Multiple spot images obtained with the fluoroscopic device and submitted for interpretation post-procedure. FLUOROSCOPY TIME:  Fluoroscopy Time:  2 minutes 12 seconds COMPARISON:  MRCP 06/23/2017 FINDINGS: Four intraoperative saved images obtained during ERCP and submitted for review. The images demonstrate a flexible endoscope in the descending duodenum with wire cannulation of the common duct. Cholangiogram demonstrates mild dilatation of the common duct. There are 2 faceted filling defects in the distal common duct consistent with choledocholithiasis. The subsequent images demonstrate sphincterotomy and balloon sweep of the common duct. IMPRESSION: 1. Choledocholithiasis. 2. ERCP with sphincterotomy and balloon sweep of the common duct. These images were submitted for radiologic interpretation only. Please see the procedural report for the amount of contrast and the fluoroscopy time utilized. Electronically Signed   By: Malachy MoanHeath  McCullough M.D.   On: 06/24/2017 08:29    Anti-infectives: Anti-infectives (From admission, onward)   None       Assessment/Plan GERD - protonix  Asthma Obesity  RUQ pain Choledocholithiasis -  s/p ERCP 4/6 by Dr. Elnoria Howard with sphincterotomy and balloon extraction  ID - none FEN - IVF, NPO for procedure VTE - SCDs Foley - none  Plan - Labs pending this AM. Patient wishes to proceed with cholecystectomy during this admission. Keep NPO and  continue IVF. Plan for surgery later this morning.   LOS: 2 days    Franne Forts , [facility] 06/25/2017, 8:17 AM Pager: [phone] Consults: [phone] Mon-Fri 7:00 am-4:30 pm Sat-Sun 7:00 am-11:30 am

## 2017-06-25 NOTE — Progress Notes (Addendum)
PROGRESS NOTE  Kevin Bauer XBD:[id number] DOB: 08-Aug-1990 DOA: 06/23/2017 PCP: [facility]  HPI/Recap of past 24 hours: Kevin Bauer is a 27 y.o. male  Past medical history significant for reflux and obesity presents with right upper quadrant pain.  Patient states the pain started approximately 1 week ago.  No history of gallbladder issues.  Denies any trauma to the abdomen.  Patient states that the pain became so intense that at times he will vomit secondary to due to pain.  Patient's oral intake has decreased due to this.  Patient denies eating a fatty diet.  06/24/17: No new complaints. Denies nausea. States has acid reflux. Given GI cocktail w improvement of symptoms. Continues on protonix po daily. POD #0 post ERCP by Dr Benson Norway. Gen surgery following for cholelithiasis.   06/25/17: reports persistent severe RUQ pain this am. Scheduled for lap chole. IV dilaudid given prn for severe pain.  Assessment/Plan: Principal Problem:   Elevated liver enzymes Active Problems:   Esophageal reflux   Asthma  Intractable abdominal pain in the setting of transaminitis MRCP without contrast revealed related to cholelithiasis with elevated bilirubin with concern for biliary obstruction Multiple small gallstone floating within the fundus and neck of the gallbladder with no intrahepatic duct dilatation no hepatic parenchymal abnormality Status post ERCP on 06/24/2017 with removal of common bile duct stones Surgery following for possible lap cholecystectomy Continue Protonix p.o. 40 mg daily  Symptomatic cholelithiasis/choledocholithiasis s/p ERCP (06/24/17) Lap chole planned today by gen surgery Pain management in place Close monitoring post surgery  Acute transaminitis, improving Alk 209 from 228 ast 264 from 384 Alt 297 from 808 Lap chole planned today 06/25/17  AK I with no history of CKD Baseline creatinine 0.9 Creatinine 1.27 on admission Today creatinine is 1.06 Avoid  nephrotoxic agent/hypotension/dehydration Repeat chemistry panel in the morning  Obesity BMI 34 Weight loss outpatient   Code Status: Full code  Family Communication: Mother at bedside  Disposition Plan: Home when clinically stable   Consultants:  GI  General surgery  Procedures:  ERCP 06/24/2017  Sphincterotomy 06/24/2017  Lap chole 06/25/17  Antimicrobials:  None  DVT prophylaxis: SCDs   Objective: Vitals:   06/25/17 1256 06/25/17 1311 06/25/17 1318 06/25/17 1334  BP: 133/85 129/79  133/75  Pulse: 87 80 86 89  Resp: (!) _0 Temp:   98.6 F (37 C) 99.2 F (37.3 C)  TempSrc:    Oral  SpO2: 99% 97% 98% 98%  Weight:      Height:        Intake/Output Summary (Last 24 hours) at 06/25/2017 1424 Last data filed at 06/25/2017 1223 Gross per 24 hour  Intake 800 ml  Output 25 ml  Net 775 ml   Filed Weights   06/23/17 0049 06/24/17 0600 06/24/17 0710  Weight: 136.1 kg (300 lb) 136.1 kg (300 lb) 115.7 kg (255 lb)    Exam: 06/25/17   General: 27 yo AAM WD wN NAD A&O x 3  Cardiovascular: RRR no rubs or gallops. No thyromegaly or JVD.  Respiratory: Clear to auscultation with no wheezes or rales.  Abdomen: Soft nontender nondistended normal bowel sounds x4  Musculoskeletal: No focal deficits  Skin: No ulcerative lesions  Psychiatry: Mood is appropriate for condition and setting.   Data Reviewed: CBC: Recent Labs  Lab 06/23/17 0045 06/24/17 0516 06/25/17 0830  WBC 5.3 6.6 9.8  HGB 14.2 14.2 12.5*  HCT 42.7 44.0 37.4*  MCV 83.2 84.3 83.1  PLT 296 292 038   Basic Metabolic Panel: Recent Labs  Lab 06/23/17 0045 06/24/17 0516 06/25/17 0830  NA 139 138 139  K 3.4* 3.7 3.0*  CL 105 103 105  CO2 23 20* 23  GLUCOSE 107* 125* 107*  BUN 7 <5* 6  CREATININE 1.27* 1.06 1.14  CALCIUM 9.0 9.5 8.5*   GFR: Estimated Creatinine Clearance: 130 mL/min (by C-G formula based on SCr of 1.14 mg/dL). Liver Function Tests: Recent Labs  Lab  06/23/17 0045 06/24/17 0516 06/25/17 0830  AST 522* 384* 264*  ALT 811* 808* 597*  ALKPHOS 179* 228* 209*  BILITOT 5.5* 7.2* 6.5*  PROT 7.8 8.7* 7.0  ALBUMIN 3.7 3.9 3.0*   Recent Labs  Lab 06/23/17 0045  LIPASE 31   No results for input(s): AMMONIA in the last 168 hours. Coagulation Profile: Recent Labs  Lab 06/24/17 0516  INR 1.03   Cardiac Enzymes: No results for input(s): CKTOTAL, CKMB, CKMBINDEX, TROPONINI in the last 168 hours. BNP (last 3 results) No results for input(s): PROBNP in the last 8760 hours. HbA1C: No results for input(s): HGBA1C in the last 72 hours. CBG: No results for input(s): GLUCAP in the last 168 hours. Lipid Profile: No results for input(s): CHOL, HDL, LDLCALC, TRIG, CHOLHDL, LDLDIRECT in the last 72 hours. Thyroid Function Tests: No results for input(s): TSH, T4TOTAL, FREET4, T3FREE, THYROIDAB in the last 72 hours. Anemia Panel: No results for input(s): VITAMINB12, FOLATE, FERRITIN, TIBC, IRON, RETICCTPCT in the last 72 hours. Urine analysis:    Component Value Date/Time   COLORURINE AMBER (A) 06/23/2017 0044   APPEARANCEUR CLEAR 06/23/2017 0044   LABSPEC 1.023 06/23/2017 0044   PHURINE 5.0 06/23/2017 0044   GLUCOSEU NEGATIVE 06/23/2017 0044   HGBUR NEGATIVE 06/23/2017 0044   BILIRUBINUR MODERATE (A) 06/23/2017 0044   KETONESUR NEGATIVE 06/23/2017 0044   PROTEINUR NEGATIVE 06/23/2017 0044   UROBILINOGEN 1.0 01/31/2012 1145   NITRITE NEGATIVE 06/23/2017 0044   LEUKOCYTESUR NEGATIVE 06/23/2017 0044   Sepsis Labs: _0 (procalcitonin:4,lacticidven:4)  ) Recent Results (from the past 240 hour(s))  MRSA PCR Screening     Status: None   Collection Time: 06/25/17  8:28 AM  Result Value Ref Range Status   MRSA by PCR NEGATIVE NEGATIVE Final    Comment:        The GeneXpert MRSA Assay (FDA approved for NASAL specimens only), is one component of a comprehensive MRSA colonization surveillance program. It is not intended to  diagnose MRSA infection nor to guide or monitor treatment for MRSA infections. Performed at [facility], [place] [address] [place], [place] [address]       Studies: No results found.  Scheduled Meds: . cefoTEtan in Dextrose 5%      . [MAR Hold] pantoprazole  40 mg Oral Q0600    Continuous Infusions: . sodium chloride 100 mL/hr at 06/25/17 0927     LOS: 2 days     Kayleen Memos, MD [facility] Pager [phone]  If 7PM-7AM, please contact night-coverage [email or web address] Password [facility] 06/25/2017, 2:24 PM

## 2017-06-25 NOTE — Anesthesia Preprocedure Evaluation (Addendum)
Anesthesia Evaluation  Patient identified by MRN, date of birth, ID band Patient awake    Reviewed: Allergy & Precautions, NPO status , Patient's Chart, lab work & pertinent test results  History of Anesthesia Complications Negative for: history of anesthetic complications  Airway Mallampati: II  TM Distance: >3 FB Neck ROM: Full    Dental  (+) Teeth Intact, Dental Advisory Given   Pulmonary asthma ,    breath sounds clear to auscultation       Cardiovascular negative cardio ROS   Rhythm:Regular     Neuro/Psych negative neurological ROS  negative psych ROS   GI/Hepatic Neg liver ROS, PUD, GERD  Medicated,  Endo/Other  negative endocrine ROS  Renal/GU negative Renal ROS     Musculoskeletal negative musculoskeletal ROS (+)   Abdominal   Peds  Hematology negative hematology ROS (+)   Anesthesia Other Findings   Reproductive/Obstetrics                             Anesthesia Physical  Anesthesia Plan  ASA: II  Anesthesia Plan: General   Post-op Pain Management:    Induction: Intravenous, Rapid sequence and Cricoid pressure planned  PONV Risk Score and Plan: 2 and Ondansetron and Dexamethasone  Airway Management Planned: Oral ETT  Additional Equipment: None  Intra-op Plan:   Post-operative Plan: Extubation in OR  Informed Consent: I have reviewed the patients History and Physical, chart, labs and discussed the procedure including the risks, benefits and alternatives for the proposed anesthesia with the patient or authorized representative who has indicated his/her understanding and acceptance.   Dental advisory given  Plan Discussed with: CRNA and Surgeon  Anesthesia Plan Comments:         Anesthesia Quick Evaluation

## 2017-06-25 NOTE — Anesthesia Procedure Notes (Signed)
Procedure Name: Intubation Date/Time: 06/25/2017 10:32 AM Performed by: Wilburn Cornelia, CRNA Pre-anesthesia Checklist: Patient identified, Emergency Drugs available, Suction available, Patient being monitored and Timeout performed Patient Re-evaluated:Patient Re-evaluated prior to induction Oxygen Delivery Method: Circle system utilized Preoxygenation: Pre-oxygenation with 100% oxygen Induction Type: IV induction Ventilation: Mask ventilation without difficulty Laryngoscope Size: Mac and 4 Grade View: Grade II Tube type: Oral Tube size: 7.5 mm Number of attempts: 1 Airway Equipment and Method: Stylet Placement Confirmation: ETT inserted through vocal cords under direct vision,  positive ETCO2,  CO2 detector and breath sounds checked- equal and bilateral Secured at: 24 cm Tube secured with: Tape Dental Injury: Teeth and Oropharynx as per pre-operative assessment

## 2017-06-26 ENCOUNTER — Encounter (HOSPITAL_COMMUNITY): Payer: Self-pay | Admitting: General Surgery

## 2017-06-26 DIAGNOSIS — K8050 Calculus of bile duct without cholangitis or cholecystitis without obstruction: Secondary | ICD-10-CM

## 2017-06-26 DIAGNOSIS — R1011 Right upper quadrant pain: Secondary | ICD-10-CM

## 2017-06-26 DIAGNOSIS — K8012 Calculus of gallbladder with acute and chronic cholecystitis without obstruction: Secondary | ICD-10-CM

## 2017-06-26 LAB — CBC
HCT: 39.4 % (ref 39.0–52.0)
Hemoglobin: 12.9 g/dL — ABNORMAL LOW (ref 13.0–17.0)
MCH: 27.4 pg (ref 26.0–34.0)
MCHC: 32.7 g/dL (ref 30.0–36.0)
MCV: 83.8 fL (ref 78.0–100.0)
Platelets: 271 10*3/uL (ref 150–400)
RBC: 4.7 MIL/uL (ref 4.22–5.81)
RDW: 14.7 % (ref 11.5–15.5)
WBC: 11.6 10*3/uL — AB (ref 4.0–10.5)

## 2017-06-26 LAB — COMPREHENSIVE METABOLIC PANEL
ALK PHOS: 229 U/L — AB (ref 38–126)
ALT: 574 U/L — AB (ref 17–63)
AST: 243 U/L — AB (ref 15–41)
Albumin: 2.9 g/dL — ABNORMAL LOW (ref 3.5–5.0)
Anion gap: 13 (ref 5–15)
BUN: 9 mg/dL (ref 6–20)
CALCIUM: 8.8 mg/dL — AB (ref 8.9–10.3)
CO2: 25 mmol/L (ref 22–32)
CREATININE: 1.06 mg/dL (ref 0.61–1.24)
Chloride: 99 mmol/L — ABNORMAL LOW (ref 101–111)
GFR calc Af Amer: >60 mL/min (ref >60)
Glucose, Bld: 101 mg/dL — ABNORMAL HIGH (ref 65–99)
Potassium: 3.3 mmol/L — ABNORMAL LOW (ref 3.5–5.1)
Sodium: 137 mmol/L (ref 135–145)
TOTAL PROTEIN: 7.4 g/dL (ref 6.5–8.1)
Total Bilirubin: 8.5 mg/dL — ABNORMAL HIGH (ref 0.3–1.2)

## 2017-06-26 LAB — MAGNESIUM: MAGNESIUM: 2 mg/dL (ref 1.7–2.4)

## 2017-06-26 MED ORDER — CALCIUM CARBONATE ANTACID 500 MG PO CHEW
1.0000 | CHEWABLE_TABLET | Freq: Once | ORAL | Status: AC
  Administered 2017-06-26: 200 mg via ORAL
  Filled 2017-06-26: qty 1

## 2017-06-26 MED ORDER — CHLORHEXIDINE GLUCONATE CLOTH 2 % EX PADS: 6.0000 | MEDICATED_PAD | Freq: Once | CUTANEOUS | Status: AC

## 2017-06-26 MED ORDER — ENOXAPARIN SODIUM 40 MG/0.4ML ~~LOC~~ SOLN
40.0000 mg | SUBCUTANEOUS | Status: DC
  Administered 2017-06-26 – 2017-06-27 (×2): 40 mg via SUBCUTANEOUS
  Filled 2017-06-26 (×2): qty 0.4

## 2017-06-26 MED ORDER — OXYCODONE HCL 5 MG PO TABS: 5.0000 mg | ORAL_TABLET | Freq: Four times a day (QID) | ORAL | 0 refills | Status: DC | PRN

## 2017-06-26 MED ORDER — POTASSIUM CHLORIDE CRYS ER 20 MEQ PO TBCR
40.0000 meq | EXTENDED_RELEASE_TABLET | Freq: Once | ORAL | Status: AC
  Administered 2017-06-26: 40 meq via ORAL
  Filled 2017-06-26: qty 2

## 2017-06-26 MED ORDER — ACETAMINOPHEN 650 MG RE SUPP: 650.0000 mg | Freq: Four times a day (QID) | RECTAL | Status: DC

## 2017-06-26 MED ORDER — PROMETHAZINE HCL 25 MG/ML IJ SOLN: 6.2500 mg | INTRAMUSCULAR | Status: DC | PRN

## 2017-06-26 MED ORDER — IBUPROFEN 200 MG PO TABS
800.0000 mg | ORAL_TABLET | Freq: Three times a day (TID) | ORAL | Status: DC
  Administered 2017-06-26 (×3): 800 mg via ORAL
  Filled 2017-06-26 (×4): qty 4

## 2017-06-26 MED ORDER — CEFAZOLIN SODIUM-DEXTROSE 2-4 GM/100ML-% IV SOLN: 2.0000 g | INTRAVENOUS | Status: DC

## 2017-06-26 MED ORDER — ACETAMINOPHEN 325 MG PO TABS
650.0000 mg | ORAL_TABLET | Freq: Four times a day (QID) | ORAL | Status: DC
  Administered 2017-06-26 (×3): 650 mg via ORAL
  Filled 2017-06-26 (×4): qty 2

## 2017-06-26 NOTE — Progress Notes (Addendum)
     [facility] Progress Note   Chief Complaint:   RUQ pain / abnormal liver chemistries   SUBJECTIVE:   Got nauseated this am after eating. Some mild upper abdominal discomfort, mainly just post-op tenderness.     ASSESSMENT AND PLAN:   27 yo male with subacute cholecystitis / choledochollithiasis, s/p ERCP with extraction of sludge / stones/ mucous. Biliary sphincterotomy performed, good drainage of bile after removal. He underwent lap cholecystectomy yesterday.  -His liver chemistries didn't improve overnight. Bili rose from 6.5 to 8.5 post procedure. Occlusion cholangiogram was negative so shouldn't be obstructed. -RN reduced diet to full liquids due to nausea, will see how he does with that.  Some of the nausea may be from pain meds -am liver labs   Attending physician's note   I have taken an interval history, reviewed the chart and examined the patient. I agree with the Advanced Practitioner's note, impression and recommendations.  Patient with choledocholithiasis status post ERCP with sphincterotomy and stone extraction followed by laparoscopic cholecystectomy.  Doing well.  Trend liver function tests.   Edman Circleaj Derrius Furtick, MD   OBJECTIVE:    Vital signs in last 24 hours: Temp:  [98.4 F (36.9 C)-99.6 F (37.6 C)] 99.6 F (37.6 C) (04/08 0510) Pulse Rate:  [67-100] 67 (04/07 1953) Resp:  [17-21] 18 (04/08 0510) BP: (121-144)/(70-85) 132/78 (04/08 0510) SpO2:  [96 %-100 %] 96 % (04/07 1953) Last BM Date: 06/23/17 General:   Alert obese black male in NAD EENT:  Normal hearing Heart:  Regular rate and rhythm; no murmurs. No lower extremity edema Pulm: Normal respiratory effort, lungs Abdomen:  Soft, mild diffuse upper abdominal tenderness. Normal bowel sounds     Neurologic:  Alert and  oriented x4;  grossly normal neurologically. Psych:  Pleasant, cooperative.  Normal mood and affect.   Intake/Output from previous day: 04/07 0701 - 04/08 0700 In: 2905  [I.V.:2655; IV Piggyback:250] Out: 325 [Urine:300; Blood:25] Intake/Output this shift: No intake/output data recorded.  Lab Results: Recent Labs    06/24/17 0516 06/25/17 0830 06/26/17 0936  WBC 6.6 9.8 11.6*  HGB 14.2 12.5* 12.9*  HCT 44.0 37.4* 39.4  PLT 292 282 271   BMET Recent Labs    06/24/17 0516 06/25/17 0830 06/26/17 0936  NA 138 139 137  K 3.7 3.0* 3.3*  CL 103 105 99*  CO2 20* 23 25  GLUCOSE 125* 107* 101*  BUN <5* 6 9  CREATININE 1.06 1.14 1.06  CALCIUM 9.5 8.5* 8.8*   LFT Recent Labs    06/26/17 0936  PROT 7.4  ALBUMIN 2.9*  AST 243*  ALT 574*  ALKPHOS 229*  BILITOT 8.5*   PT/INR Recent Labs    06/24/17 0516  LABPROT 13.4  INR 1.03    Principal Problem:   Elevated liver enzymes Active Problems:   Esophageal reflux   Asthma     LOS: 3 days   Willette ClusterPaula Guenther ,NP 06/26/2017, 11:02 AM  Pager number [phone]

## 2017-06-26 NOTE — Progress Notes (Signed)
PROGRESS NOTE  Kevin Bauer ZOX:[id number] DOB: 24-Oct-1990 DOA: 06/23/2017 PCP: [facility]  HPI/Recap of past 24 hours: Kevin Bauer is a 27 y.o. male  Past medical history significant for reflux and obesity presents with right upper quadrant pain.  Patient states the pain started approximately 1 week ago.  No history of gallbladder issues.  Denies any trauma to the abdomen.  Patient states that the pain became so intense that at times he will vomit secondary to due to pain.  Patient's oral intake has decreased due to this.  Patient denies eating a fatty diet.  06/24/17: No new complaints. Denies nausea. States has acid reflux. Given GI cocktail w improvement of symptoms. Continues on protonix po daily. POD #0 post ERCP by Dr Elnoria Howard. Gen surgery following for cholelithiasis.   06/25/17: reports persistent severe RUQ pain this am. Scheduled for lap chole. IV dilaudid given prn for severe pain.  06/26/17: Complaints of abd pain and persistent nausea. Not tolerating solid diet. RN appropriately reduced to full liquid diet. Will continue to monitor closely. Denies flatus for me this morning around 7 am.  Assessment/Plan: Principal Problem:   Elevated liver enzymes Active Problems:   Esophageal reflux   Asthma  Intractable abdominal pain in the setting of transaminitis MRCP without contrast revealed related to cholelithiasis with elevated bilirubin with concern for biliary obstruction Multiple small gallstone floating within the fundus and neck of the gallbladder with no intrahepatic duct dilatation no hepatic parenchymal abnormality Status post ERCP on 06/24/2017 POD #1 with removal of common bile duct stones S/p lap chole on 06/25/17. Gen surgery following  Continue full liquid diet as tolerated.  Intractable nausea and vomiting Phenergan IV added for intractable nausea Continue zofran IV prn  Elevated Tbilirubin, worsening  T bili from 6.5 to 8.5 GI and gen surgery  following Repeat levels in the morning  Symptomatic cholelithiasis/choledocholithiasis s/p ERCP (06/24/17) Lap chole 06/25/17 by gen surgery Pain management in place Close monitoring post surgery  Acute transaminitis, improving Repeat levels am  AK I with no history of CKD Baseline creatinine 0.9 Creatinine 1.27 on admission Today creatinine is 1.06 Avoid nephrotoxic agent/hypotension/dehydration Repeat chemistry panel in the morning  Obesity BMI 34 Weight loss outpatient   Code Status: Full code  Family Communication: Mother at bedside  Disposition Plan: Home when clinically stable   Consultants:  GI  General surgery  Procedures:  ERCP 06/24/2017  Sphincterotomy 06/24/2017  Lap chole 06/25/17  Antimicrobials:  None  DVT prophylaxis: SCDs, sq lovenox daily   Objective: Vitals:   06/25/17 1334 06/25/17 1800 06/25/17 1953 06/26/17 0510  BP: 133/75 123/70 121/75 132/78  Pulse: 89 72 67   Resp: 18 18  18   Temp: 99.2 F (37.3 C) 99.1 F (37.3 C) 98.4 F (36.9 C) 99.6 F (37.6 C)  TempSrc: Oral Oral Oral Oral  SpO2: 98% 96% 96%   Weight:      Height:        Intake/Output Summary (Last 24 hours) at 06/26/2017 1409 Last data filed at 06/26/2017 0400 Gross per 24 hour  Intake 2105 ml  Output 300 ml  Net 1805 ml   Filed Weights   06/23/17 0049 06/24/17 0600 06/24/17 0710  Weight: 136.1 kg (300 lb) 136.1 kg (300 lb) 115.7 kg (255 lb)    Exam: 06/26/17   General: 27 yo AAM WD WN NAD A&O x 3  Cardiovascular: RRR no rubs or gallops. No JVD or thyromegaly  Respiratory: Clear to auscultation  with no wheezes or rales.  Abdomen: Soft nontender nondistended normal bowel sounds x4  Musculoskeletal: No focal deficits  Skin: No ulcerative lesions  Psychiatry: Mood is appropriate for condition and setting.   Data Reviewed: CBC: Recent Labs  Lab 06/23/17 0045 06/24/17 0516 06/25/17 0830 06/26/17 0936  WBC 5.3 6.6 9.8 11.6*  HGB 14.2 14.2 12.5* 12.9*   HCT 42.7 44.0 37.4* 39.4  MCV 83.2 84.3 83.1 83.8  PLT 296 292 282 271   Basic Metabolic Panel: Recent Labs  Lab 06/23/17 0045 06/24/17 0516 06/25/17 0830 06/26/17 0936  NA 139 138 139 137  K 3.4* 3.7 3.0* 3.3*  CL 105 103 105 99*  CO2 23 20* 23 25  GLUCOSE 107* 125* 107* 101*  BUN 7 <5* 6 9  CREATININE 1.27* 1.06 1.14 1.06  CALCIUM 9.0 9.5 8.5* 8.8*  MG  --   --   --  2.0   GFR: Estimated Creatinine Clearance: 139.8 mL/min (by C-G formula based on SCr of 1.06 mg/dL). Liver Function Tests: Recent Labs  Lab 06/23/17 0045 06/24/17 0516 06/25/17 0830 06/26/17 0936  AST 522* 384* 264* 243*  ALT 811* 808* 597* 574*  ALKPHOS 179* 228* 209* 229*  BILITOT 5.5* 7.2* 6.5* 8.5*  PROT 7.8 8.7* 7.0 7.4  ALBUMIN 3.7 3.9 3.0* 2.9*   Recent Labs  Lab 06/23/17 0045  LIPASE 31   No results for input(s): AMMONIA in the last 168 hours. Coagulation Profile: Recent Labs  Lab 06/24/17 0516  INR 1.03   Cardiac Enzymes: No results for input(s): CKTOTAL, CKMB, CKMBINDEX, TROPONINI in the last 168 hours. BNP (last 3 results) No results for input(s): PROBNP in the last 8760 hours. HbA1C: No results for input(s): HGBA1C in the last 72 hours. CBG: No results for input(s): GLUCAP in the last 168 hours. Lipid Profile: No results for input(s): CHOL, HDL, LDLCALC, TRIG, CHOLHDL, LDLDIRECT in the last 72 hours. Thyroid Function Tests: No results for input(s): TSH, T4TOTAL, FREET4, T3FREE, THYROIDAB in the last 72 hours. Anemia Panel: No results for input(s): VITAMINB12, FOLATE, FERRITIN, TIBC, IRON, RETICCTPCT in the last 72 hours. Urine analysis:    Component Value Date/Time   COLORURINE AMBER (A) 06/23/2017 0044   APPEARANCEUR CLEAR 06/23/2017 0044   LABSPEC 1.023 06/23/2017 0044   PHURINE 5.0 06/23/2017 0044   GLUCOSEU NEGATIVE 06/23/2017 0044   HGBUR NEGATIVE 06/23/2017 0044   BILIRUBINUR MODERATE (A) 06/23/2017 0044   KETONESUR NEGATIVE 06/23/2017 0044   PROTEINUR  NEGATIVE 06/23/2017 0044   UROBILINOGEN 1.0 01/31/2012 1145   NITRITE NEGATIVE 06/23/2017 0044   LEUKOCYTESUR NEGATIVE 06/23/2017 0044   Sepsis Labs: @LABRCNTIP (procalcitonin:4,lacticidven:4)  ) Recent Results (from the past 240 hour(s))  MRSA PCR Screening     Status: None   Collection Time: 06/25/17  8:28 AM  Result Value Ref Range Status   MRSA by PCR NEGATIVE NEGATIVE Final    Comment:        The GeneXpert MRSA Assay (FDA approved for NASAL specimens only), is one component of a comprehensive MRSA colonization surveillance program. It is not intended to diagnose MRSA infection nor to guide or monitor treatment for MRSA infections. Performed at [facility], [address], [place], [place] [address]       Studies: No results found.  Scheduled Meds: . acetaminophen  650 mg Oral QID   Or  . acetaminophen  650 mg Rectal QID  . enoxaparin (LOVENOX) injection  40 mg Subcutaneous Q24H  . ibuprofen  800  mg Oral TID  . pantoprazole  40 mg Oral Q0600    Continuous Infusions: . sodium chloride 100 mL/hr at 06/25/17 1730     LOS: 3 days     Darlin Droparole N Kevin Sleeth, MD [facility] Pager [phone]  If 7PM-7AM, please contact night-coverage [email or web address] Password [email or web address] 06/26/2017, 2:09 PM

## 2017-06-26 NOTE — Progress Notes (Addendum)
1 Day Post-Op  Subjective: Pt sore.  C/o urine still dark and trouble voiding at times.  Tolerating a solid diet this am.  Passing flatus  Objective: Vital signs in last 24 hours: Temp:  [98.4 F (36.9 C)-99.6 F (37.6 C)] 99.6 F (37.6 C) (04/08 0510) Pulse Rate:  [67-100] 67 (04/07 1953) Resp:  [17-21] 18 (04/08 0510) BP: (121-144)/(70-85) 132/78 (04/08 0510) SpO2:  [96 %-100 %] 96 % (04/07 1953) Last BM Date: 06/23/17  Intake/Output from previous day: 04/07 0701 - 04/08 0700 In: 2905 [I.V.:2655; IV Piggyback:250] Out: 325 [Urine:300; Blood:25] Intake/Output this shift: No intake/output data recorded.  PE: Soft, appropriately tender, +BS, ND, incisions are c/d/i  Lab Results:  Recent Labs    06/25/17 0830 06/26/17 0936  WBC 9.8 11.6*  HGB 12.5* 12.9*  HCT 37.4* 39.4  PLT 282 271   BMET Recent Labs    06/24/17 0516 06/25/17 0830  NA 138 139  K 3.7 3.0*  CL 103 105  CO2 20* 23  GLUCOSE 125* 107*  BUN <5* 6  CREATININE 1.06 1.14  CALCIUM 9.5 8.5*   PT/INR Recent Labs    06/24/17 0516  LABPROT 13.4  INR 1.03   CMP     Component Value Date/Time   NA 139 06/25/2017 0830   K 3.0 (L) 06/25/2017 0830   CL 105 06/25/2017 0830   CO2 23 06/25/2017 0830   GLUCOSE 107 (H) 06/25/2017 0830   BUN 6 06/25/2017 0830   CREATININE 1.14 06/25/2017 0830   CALCIUM 8.5 (L) 06/25/2017 0830   PROT 7.0 06/25/2017 0830   ALBUMIN 3.0 (L) 06/25/2017 0830   AST 264 (H) 06/25/2017 0830   ALT 597 (H) 06/25/2017 0830   ALKPHOS 209 (H) 06/25/2017 0830   BILITOT 6.5 (H) 06/25/2017 0830   GFRNONAA >60 06/25/2017 0830   GFRAA >60 06/25/2017 0830   Lipase     Component Value Date/Time   LIPASE 31 06/23/2017 0045       Studies/Results: No results found.  Anti-infectives: Anti-infectives (From admission, onward)   Start     Dose/Rate Route Frequency Ordered Stop   06/26/17 0755  ceFAZolin (ANCEF) IVPB 2g/100 mL premix  Status:  Discontinued     2 g 200  mL/hr over 30 Minutes Intravenous On call to O.R. 06/26/17 0755 06/26/17 0757   06/25/17 1020  cefoTEtan in Dextrose 5% (CEFOTAN) 2-2.08 GM-%(50ML) IVPB    Note to Pharmacy:  Shireen Quanodd, Robert   : cabinet override      06/25/17 1020 06/25/17 2229   06/25/17 1000  cefoTEtan (CEFOTAN) 2 g in sodium chloride 0.9 % 100 mL IVPB     2 g 200 mL/hr over 30 Minutes Intravenous On call to O.R. 06/25/17 0931 06/25/17 1040       Assessment/Plan GERD - protonix  Asthma Obesity  RUQ pain. Choledocholithiasis, POD 1, s/p lap chole -s/p ERCP 4/6 by Dr. Elnoria HowardHung withsphincterotomy and balloon extraction -sore as expected.  Add tylenol and ibuprofen scheduled for pain control -some issues voiding.  May need to check post void residuals.  Not uncommon after general anesthesia. -labs pending -will check labs when he follows up in the office.  ID -pre-op abx FEN -low fat diet VTE -SCDs, ok to add Lovenox/Heparin if planning to stay past today Foley -none  Plan- Labs pending this AM. if trending down and he is otherwise medically stable, he is surgically stable for DC home today from our standpoint.  I explained to  he and his mother that his urine will take a while to return to a normal color as his TB is still elevated.  We also discussed plan to repeat labs prior to follow up appt as well as all discharge instructions.     LOS: 3 days    Letha Cape , [facility] 06/26/2017, 10:13 AM Pager: [phone]

## 2017-06-26 NOTE — Progress Notes (Signed)
TB up to 8.5 today.  Suspect this is not worrisome, but can follow this to make sure it is down trending and if so then he can go home and get his labs checked as an outpatient.  Kevin Bauer 11:03 AM 06/26/2017

## 2017-06-27 DIAGNOSIS — R1084 Generalized abdominal pain: Secondary | ICD-10-CM

## 2017-06-27 LAB — HEPATIC FUNCTION PANEL
ALK PHOS: 208 U/L — AB (ref 38–126)
ALT: 445 U/L — ABNORMAL HIGH (ref 17–63)
AST: 137 U/L — ABNORMAL HIGH (ref 15–41)
Albumin: 2.6 g/dL — ABNORMAL LOW (ref 3.5–5.0)
BILIRUBIN INDIRECT: 2.9 mg/dL — AB (ref 0.3–0.9)
BILIRUBIN TOTAL: 8 mg/dL — AB (ref 0.3–1.2)
Bilirubin, Direct: 5.1 mg/dL — ABNORMAL HIGH (ref 0.1–0.5)
Total Protein: 6.7 g/dL (ref 6.5–8.1)

## 2017-06-27 MED ORDER — PANTOPRAZOLE SODIUM 40 MG PO TBEC: 40.0000 mg | DELAYED_RELEASE_TABLET | Freq: Every day | ORAL | 0 refills | Status: AC

## 2017-06-27 NOTE — Progress Notes (Signed)
Patient ID: Kevin Bauer, male   DOB: 03/03/1991, 27 y.o.   MRN: [id number]    2 Days Post-Op  Subjective: Patient feels much better today.  Eating well.  Voiding well.  Minimal pain.  Passing flatus  Objective: Vital signs in last 24 hours: Temp:  [97.6 F (36.4 C)-98.9 F (37.2 C)] 98.9 F (37.2 C) (04/09 0900) Pulse Rate:  [60-68] 68 (04/09 0900) Resp:  [16-17] 17 (04/09 0900) BP: (121-136)/(71-85) 127/79 (04/09 0900) SpO2:  [98 %-100 %] 98 % (04/09 0900) Last BM Date: 06/24/17  Intake/Output from previous day: No intake/output data recorded. Intake/Output this shift: No intake/output data recorded.  PE: Abd: soft, appropriately tender, +BS, ND, incisions c/d/i  Lab Results:  Recent Labs    06/25/17 0830 06/26/17 0936  WBC 9.8 11.6*  HGB 12.5* 12.9*  HCT 37.4* 39.4  PLT 282 271   BMET Recent Labs    06/25/17 0830 06/26/17 0936  NA 139 137  K 3.0* 3.3*  CL 105 99*  CO2 23 25  GLUCOSE 107* 101*  BUN 6 9  CREATININE 1.14 1.06  CALCIUM 8.5* 8.8*   PT/INR No results for input(s): LABPROT, INR in the last 72 hours. CMP     Component Value Date/Time   NA 137 06/26/2017 0936   K 3.3 (L) 06/26/2017 0936   CL 99 (L) 06/26/2017 0936   CO2 25 06/26/2017 0936   GLUCOSE 101 (H) 06/26/2017 0936   BUN 9 06/26/2017 0936   CREATININE 1.06 06/26/2017 0936   CALCIUM 8.8 (L) 06/26/2017 0936   PROT 6.7 06/27/2017 0516   ALBUMIN 2.6 (L) 06/27/2017 0516   AST 137 (H) 06/27/2017 0516   ALT 445 (H) 06/27/2017 0516   ALKPHOS 208 (H) 06/27/2017 0516   BILITOT 8.0 (H) 06/27/2017 0516   GFRNONAA >60 06/26/2017 0936   GFRAA >60 06/26/2017 0936   Lipase     Component Value Date/Time   LIPASE 31 06/23/2017 0045       Studies/Results: No results found.  Anti-infectives: Anti-infectives (From admission, onward)   Start     Dose/Rate Route Frequency Ordered Stop   06/26/17 0755  ceFAZolin (ANCEF) IVPB 2g/100 mL premix  Status:  Discontinued     2 g 200 mL/hr over  30 Minutes Intravenous On call to O.R. 06/26/17 0755 06/26/17 0757   06/25/17 1020  cefoTEtan in Dextrose 5% (CEFOTAN) 2-2.08 GM-%(50ML) IVPB    Note to Pharmacy:  Shireen Quanodd, Robert   : cabinet override      06/25/17 1020 06/25/17 2229   06/25/17 1000  cefoTEtan (CEFOTAN) 2 g in sodium chloride 0.9 % 100 mL IVPB     2 g 200 mL/hr over 30 Minutes Intravenous On call to O.R. 06/25/17 0931 06/25/17 1040       Assessment/Plan GERD - protonix  Asthma Obesity  RUQ pain. Choledocholithiasis, POD 2, s/p lap chole -s/p ERCP 4/6 by Dr. Elnoria HowardHung withsphincterotomy and balloon extraction -TB down from 8.5 to 8 today.  This is likely secondary to reactivity from his CBD stones and ERCP.  He has no significant pain, N/V, fevers, etc.  He is surgically stable for DC home.  He will have a CMET prior to his follow up appointment to assure his LFTs are trending down.  ID -pre-op abx FEN -low fat diet VTE -SCDs, Lovenox Foley -none  Plan-Surgically stable for DC home.  TB trending down.  Likely up yesterday due to reactivity from surgery and ERCP with a very edematous CBD.  CMET prior to his follow up appointment which has already been made and order for CMET given to him.  Rx for tramadol also already printed for patient as well.    LOS: 4 days    Letha Cape , [facility] 06/27/2017, 9:47 AM Pager: [phone]

## 2017-06-27 NOTE — Discharge Summary (Signed)
Discharge Summary  Kevin Bauer ZOX:[id number] DOB: [birth year]  PCP: [facility]  Admit date: 06/23/2017 Discharge date: 06/27/2017  Time spent: 25 minutes  Recommendations for Outpatient Follow-up:  1. Follow up with General surgery 2. Take your medications as prescribed  Discharge Diagnoses:  Active Hospital Problems   Diagnosis Date Noted  . Elevated liver enzymes 06/23/2017  . Right upper quadrant abdominal pain   . Common bile duct stone   . Asthma 06/23/2017  . Esophageal reflux 04/17/2012    Resolved Hospital Problems  No resolved problems to display.    Discharge Condition: Stable  Diet recommendation: Resume previous low-fat diet  Vitals:   06/27/17 0900 06/27/17 1424  BP: 127/79 120/61  Pulse: 68 88  Resp: 17 16  Temp: 98.9 F (37.2 C) 98.8 F (37.1 C)  SpO2: 98% 96%    History of present illness:  Kevin Byrdis a 27 y.o.malePast medical history significant for reflux and obesity presents with right upper quadrant pain. Patient states the pain started approximately 1 week ago. No history of gallbladder issues. Denies any trauma to the abdomen. Patient states that the pain became so intense that at times he will vomit secondary to due to pain. Patient's oral intake has decreased due to this. Patient denies eating a fatty diet.  06/24/17: No new complaints. Denies nausea. States has acid reflux. Given GI cocktail w improvement of symptoms. Continues on protonix po daily. POD #0 post ERCP by Dr Elnoria Howard. Gen surgery following for cholelithiasis.   06/25/17: reports persistent severe RUQ pain this am. Scheduled for lap chole. IV dilaudid given prn for severe pain.  06/26/17: Complaints of abd pain and persistent nausea. Not tolerating solid diet. RN appropriately reduced to full liquid diet. Will continue to monitor closely. Denies flatus for me this morning around 7 am.  06/27/2017: Patient seen and examined at his bedside.  No new complaints.   His abdominal pain is well controlled on current pain medications.  Denies any nausea or vomiting.  Patient is tolerating oral diet.  The day of discharge patient was hemodynamically stable he will need to follow-up with general surgery.  Hospital Course:  Principal Problem:   Elevated liver enzymes Active Problems:   Esophageal reflux   Asthma   Right upper quadrant abdominal pain   Common bile duct stone  Intractable abdominal pain in the setting of transaminitis MRCP without contrast revealed related to cholelithiasis with elevated bilirubin with concern for biliary obstruction Multiple small gallstone floating within the fundus and neck of the gallbladder with no intrahepatic duct dilatation no hepatic parenchymal abnormality Status post ERCP on 06/24/2017 POD #1 with removal of common bile duct stones S/p lap chole on 06/25/17. Gen surgery. Follow-up with general surgery outpatient  Intractable nausea and vomiting, resolved  Elevated Tbilirubin, improving T bili 8.0 from 8.5 from 6.5 GI was following now signed off.  Symptomatic cholelithiasis/choledocholithiasis s/p ERCP (06/24/17) Lap chole 06/25/17 by gen surgery Pain management in place  Acute transaminitis, improving Repeat levels am continues to improve  AK I with no history of CKD Baseline creatinine 0.9 Creatinine 1.27 on admission creatinine is 1.06 Avoid nephrotoxic agent/hypotension/dehydration  Obesity BMI 34 Weight loss outpatient   Procedures:  Lap chole  Consultations:  GI  General surgery  Discharge Exam: BP 120/61 (BP Location: Right Arm)   Pulse 88   Temp 98.8 F (37.1 C) (Oral)   Resp 16   Ht 6' (1.829 m)   Wt 115.7 kg (255 lb)  SpO2 96%   BMI 34.58 kg/m   General: 27 year old African-American male well-developed well-nourished in no acute distress.  Alert oriented x3. Cardiovascular: Regular rate and rhythm with no rubs or gallops.  No thyromegaly or JVD present. Respiratory:  Clear to auscultation with no wheezes or rales.  Discharge Instructions You were cared for by a hospitalist during your hospital stay. If you have any questions about your discharge medications or the care you received while you were in the hospital after you are discharged, you can call the unit and asked to speak with the hospitalist on call if the hospitalist that took care of you is not available. Once you are discharged, your primary care physician will handle any further medical issues. Please note that NO REFILLS for any discharge medications will be authorized once you are discharged, as it is imperative that you return to your primary care physician (or establish a relationship with a primary care physician if you do not have one) for your aftercare needs so that they can reassess your need for medications and monitor your lab values.   Allergies as of 06/27/2017   No Known Allergies     Medication List    STOP taking these medications   dexlansoprazole 60 MG capsule Commonly known as:  DEXILANT   ibuprofen 200 MG tablet Commonly known as:  ADVIL,MOTRIN     TAKE these medications   MULTI-VITAMIN GUMMIES Chew Chew 1 tablet by mouth once.   oxyCODONE 5 MG immediate release tablet Commonly known as:  ROXICODONE Take 1 tablet (5 mg total) by mouth every 6 (six) hours as needed.   pantoprazole 40 MG tablet Commonly known as:  PROTONIX Take 1 tablet (40 mg total) by mouth daily at 6 (six) AM. Start taking on:  06/28/2017      No Known Allergies Follow-up Information    [facility] Follow up on 07/18/2017.   Specialty:  General Surgery Why:  9:00am.  arrive by 8:30 for check in process.  please bring photo ID and insurance card with you to your appointment.  Please get your labs checked 2 days prior to this appointment Contact information: [address] [place] [id number] [phone]       [facility], [facility] Follow up in 3 day(s).   Contact information: [address] [place] [address] [phone]        Meredith PelGuenther, Paula M, NP Follow up in 2 week(s).   Specialty:  Gastroenterology Why:  please get CMP repeated on 07/11/17 at the office to monitor your LFTs. Contact information: [address] [place] [address] [phone]            The results of significant diagnostics from this hospitalization (including imaging, microbiology, ancillary and laboratory) are listed below for reference.    Significant Diagnostic Studies: Dg Abdomen 1 View  Result Date: 06/23/2017 CLINICAL DATA:  Generalized abdominal pain for 1 week. No bowel movement for a few days. Right upper quadrant pain. EXAM: ABDOMEN - 1 VIEW COMPARISON:  None. FINDINGS: Gas and stool throughout the colon. No small or large bowel distention. No radiopaque stones. Visualized bones appear intact. Soft tissue contours are normal. IMPRESSION: Normal nonobstructive bowel gas pattern with stool-filled colon. Electronically Signed   By: Burman NievesWilliam  Stevens M.D.   On: 06/23/2017 02:18   Mr Abdomen Mrcp Wo Contrast  Result Date: 06/23/2017 CLINICAL DATA:  Abnormal liver function tests.  Cholelithiasis EXAM: MRI ABDOMEN WITHOUT  CONTRAST  (INCLUDING MRCP) TECHNIQUE: Multiplanar multisequence MR imaging of the abdomen was performed. Heavily T2-weighted images of the biliary and pancreatic ducts were obtained, and three-dimensional MRCP images were rendered by post processing. No contrast was ordered with scattered that of note cellules ordered without IV contrast; however, patient became extremely nauseous and projectile vomiting during the scan. Therefore IV contrast cannot be given. COMPARISON:  None. FINDINGS: Lower chest:  Lung bases are clear. Hepatobiliary: No focal hepatic lesion. No intrahepatic duct dilatation. The common bile duct has multiple several small filling defects which are rounded and measure approximately 3 mm  each (image 11/100001, coronal thin MRCP) The gallbladder has multiple small gallstones floating within the fundus (image 17 of coronal MRCP series). The small stones measure 4 to a 6 mm and number approximately 50. Stones also are present in the neck of the gallbladder (image 14/10001. Pancreas: Normal pancreatic parenchymal intensity. No ductal dilatation or inflammation. Spleen: Normal spleen. Adrenals/urinary tract: Adrenal glands and kidneys are normal. Stomach/Bowel: Stomach and limited of the small bowel is unremarkable Vascular/Lymphatic: Abdominal aortic normal caliber. No retroperitoneal periportal lymphadenopathy. Musculoskeletal: No aggressive osseous lesion IMPRESSION: 1. Choledocholithiasis with elevated bilirubin is concern for biliary obstruction. 2. Multiple small gallstones floating within the fundus and neck the gallbladder. 3. No intrahepatic duct dilatation. 4. No hepatic parenchymal abnormality on this noncontrast exam. These results will be called to the ordering clinician or representative by the Radiologist Assistant, and communication documented in the PACS or zVision Dashboard. Electronically Signed   By: Genevive Bi M.D.   On: 06/23/2017 16:47   Mr 3d Recon At Scanner  Result Date: 06/23/2017 CLINICAL DATA:  Abnormal liver function tests.  Cholelithiasis EXAM: MRI ABDOMEN WITHOUT CONTRAST  (INCLUDING MRCP) TECHNIQUE: Multiplanar multisequence MR imaging of the abdomen was performed. Heavily T2-weighted images of the biliary and pancreatic ducts were obtained, and three-dimensional MRCP images were rendered by post processing. No contrast was ordered with scattered that of note cellules ordered without IV contrast; however, patient became extremely nauseous and projectile vomiting during the scan. Therefore IV contrast cannot be given. COMPARISON:  None. FINDINGS: Lower chest:  Lung bases are clear. Hepatobiliary: No focal hepatic lesion. No intrahepatic duct dilatation. The common  bile duct has multiple several small filling defects which are rounded and measure approximately 3 mm each (image 11/100001, coronal thin MRCP) The gallbladder has multiple small gallstones floating within the fundus (image 17 of coronal MRCP series). The small stones measure 4 to a 6 mm and number approximately 50. Stones also are present in the neck of the gallbladder (image 14/10001. Pancreas: Normal pancreatic parenchymal intensity. No ductal dilatation or inflammation. Spleen: Normal spleen. Adrenals/urinary tract: Adrenal glands and kidneys are normal. Stomach/Bowel: Stomach and limited of the small bowel is unremarkable Vascular/Lymphatic: Abdominal aortic normal caliber. No retroperitoneal periportal lymphadenopathy. Musculoskeletal: No aggressive osseous lesion IMPRESSION: 1. Choledocholithiasis with elevated bilirubin is concern for biliary obstruction. 2. Multiple small gallstones floating within the fundus and neck the gallbladder. 3. No intrahepatic duct dilatation. 4. No hepatic parenchymal abnormality on this noncontrast exam. These results will be called to the ordering clinician or representative by the Radiologist Assistant, and communication documented in the PACS or zVision Dashboard. Electronically Signed   By: Genevive Bi M.D.   On: 06/23/2017 16:47   Dg Ercp Biliary & Pancreatic Ducts  Result Date: 06/24/2017 CLINICAL DATA:  27 year old male with choledocholithiasis EXAM: ERCP TECHNIQUE: Multiple spot images obtained with the fluoroscopic device and submitted for interpretation  post-procedure. FLUOROSCOPY TIME:  Fluoroscopy Time:  2 minutes 12 seconds COMPARISON:  MRCP 06/23/2017 FINDINGS: Four intraoperative saved images obtained during ERCP and submitted for review. The images demonstrate a flexible endoscope in the descending duodenum with wire cannulation of the common duct. Cholangiogram demonstrates mild dilatation of the common duct. There are 2 faceted filling defects in the  distal common duct consistent with choledocholithiasis. The subsequent images demonstrate sphincterotomy and balloon sweep of the common duct. IMPRESSION: 1. Choledocholithiasis. 2. ERCP with sphincterotomy and balloon sweep of the common duct. These images were submitted for radiologic interpretation only. Please see the procedural report for the amount of contrast and the fluoroscopy time utilized. Electronically Signed   By: Malachy Moan M.D.   On: 06/24/2017 08:29   US Abdomen Limited Ruq  Result Date: 06/23/2017 CLINICAL DATA:  Right upper quadrant pain and nausea EXAM: ULTRASOUND ABDOMEN LIMITED RIGHT UPPER QUADRANT COMPARISON:  None. FINDINGS: Gallbladder: There are numerous stones within the gallbladder. No gallbladder wall thickening nor pericholecystic fluid. There is also sludge within the gallbladder. Negative sonographic Murphy sign. Common bile duct: Diameter: 5.6 mm Liver: Heterogeneous hepatic echotexture without discrete mass. Portal vein is patent on color Doppler imaging with normal direction of blood flow towards the liver. IMPRESSION: Cholelithiasis without other evidence of acute cholecystitis. Electronically Signed   By: Deatra Robinson M.D.   On: 06/23/2017 06:05    Microbiology: Recent Results (from the past 240 hour(s))  MRSA PCR Screening     Status: None   Collection Time: 06/25/17  8:28 AM  Result Value Ref Range Status   MRSA by PCR NEGATIVE NEGATIVE Final    Comment:        The GeneXpert MRSA Assay (FDA approved for NASAL specimens only), is one component of a comprehensive MRSA colonization surveillance program. It is not intended to diagnose MRSA infection nor to guide or monitor treatment for MRSA infections. Performed at [facility], [address] [place], [place] [address]      Labs: Basic Metabolic Panel: Recent Labs  Lab 06/23/17 0045 06/24/17 0516 06/25/17 0830 06/26/17 0936  NA 139 138 139 137  K 3.4* 3.7 3.0* 3.3*  CL 105 103  105 99*  CO2 23 20* 23 25  GLUCOSE 107* 125* 107* 101*  BUN 7 <5* 6 9  CREATININE 1.27* 1.06 1.14 1.06  CALCIUM 9.0 9.5 8.5* 8.8*  MG  --   --   --  2.0   Liver Function Tests: Recent Labs  Lab 06/23/17 0045 06/24/17 0516 06/25/17 0830 06/26/17 0936 06/27/17 0516  AST 522* 384* 264* 243* 137*  ALT 811* 808* 597* 574* 445*  ALKPHOS 179* 228* 209* 229* 208*  BILITOT 5.5* 7.2* 6.5* 8.5* 8.0*  PROT 7.8 8.7* 7.0 7.4 6.7  ALBUMIN 3.7 3.9 3.0* 2.9* 2.6*   Recent Labs  Lab 06/23/17 0045  LIPASE 31   No results for input(s): AMMONIA in the last 168 hours. CBC: Recent Labs  Lab 06/23/17 0045 06/24/17 0516 06/25/17 0830 06/26/17 0936  WBC 5.3 6.6 9.8 11.6*  HGB 14.2 14.2 12.5* 12.9*  HCT 42.7 44.0 37.4* 39.4  MCV 83.2 84.3 83.1 83.8  PLT 296 292 282 271   Cardiac Enzymes: No results for input(s): CKTOTAL, CKMB, CKMBINDEX, TROPONINI in the last 168 hours. BNP: BNP (last 3 results) No results for input(s): BNP in the last 8760 hours.  ProBNP (last 3 results) No results for input(s): PROBNP in the last 8760 hours.  CBG:  No results for input(s): GLUCAP in the last 168 hours.     Signed:  Darlin Drop, MD [facility] 06/27/2017, 2:27 PM

## 2017-06-27 NOTE — Progress Notes (Signed)
Pt given discharge instructions and prescriptions. Answered all questions to satisfaction. Waiting on ride.

## 2017-06-27 NOTE — Plan of Care (Signed)
  Problem: Health Behavior/Discharge Planning: Goal: Ability to manage health-related needs will improve Outcome: Progressing   Problem: Activity: Goal: Risk for activity intolerance will decrease Outcome: Progressing   Problem: Pain Managment: Goal: General experience of comfort will improve Outcome: Adequate for Discharge   

## 2017-06-27 NOTE — Progress Notes (Addendum)
     [facility] Progress Note   Chief Complaint:   Bile duct stones   SUBJECTIVE:   Feels better today, sore from surgery but no significant pain and no further nausea. Tolerating PO    ASSESSMENT AND PLAN:   27 yo male with subacute cholecystititis/ cholecystitis, s/p ERCP with stone extraction / sphincterotomy followed by lap cholecystectomy. Liver chemistries improved overnight. -improved and stable for discharge from GI standpoint    OBJECTIVE:     Vital signs in last 24 hours: Temp:  [97.6 F (36.4 C)-98.2 F (36.8 C)] 98.2 F (36.8 C) (04/09 0417) Pulse Rate:  [60-66] 60 (04/09 0417) Resp:  [16-17] 17 (04/09 0417) BP: (121-136)/(71-85) 136/85 (04/09 0417) SpO2:  [100 %] 100 % (04/09 0417) Last BM Date: 06/24/17 General:  Pleasant, alert, black male in NAD EENT:  Normal hearing Heart:  Regular rate and rhythm, no lower extremity edema Pulm: Normal respiratory effort Abdomen:  Soft, nondistended, nontender.  Normal bowel sounds, no masses felt. No hepatomegaly.    Neurologic:  Alert and  oriented x4;  grossly normal neurologically. Psych:  Pleasant, cooperative.  Normal mood and affect.   Intake/Output from previous day: No intake/output data recorded. Intake/Output this shift: No intake/output data recorded.  Lab Results: Recent Labs    06/25/17 0830 06/26/17 0936  WBC 9.8 11.6*  HGB 12.5* 12.9*  HCT 37.4* 39.4  PLT 282 271   BMET Recent Labs    06/25/17 0830 06/26/17 0936  NA 139 137  K 3.0* 3.3*  CL 105 99*  CO2 23 25  GLUCOSE 107* 101*  BUN 6 9  CREATININE 1.14 1.06  CALCIUM 8.5* 8.8*   LFT Recent Labs    06/27/17 0516  PROT 6.7  ALBUMIN 2.6*  AST 137*  ALT 445*  ALKPHOS 208*  BILITOT 8.0*  BILIDIR 5.1*  IBILI 2.9*   Discharge Planning Diet: as tolerated Anticoagulation and antiplatelets: NA Discharge Medications: none for GI Follow up: labs at our office in 2 weeks, we will call him to arrange  Principal  Problem:   Elevated liver enzymes Active Problems:   Esophageal reflux   Asthma   Right upper quadrant abdominal pain   Common bile duct stone     LOS: 4 days   Willette Cluster ,NP 06/27/2017, 9:24 AM  Pager number [phone]

## 2017-06-29 ENCOUNTER — Other Ambulatory Visit: Payer: Self-pay

## 2017-06-29 ENCOUNTER — Telehealth: Payer: Self-pay

## 2017-06-29 DIAGNOSIS — R748 Abnormal levels of other serum enzymes: Secondary | ICD-10-CM

## 2017-06-29 NOTE — Telephone Encounter (Signed)
Spoke with the patient. He "has this on the papers" and is planning to do this. Orders in Epic.

## 2017-06-29 NOTE — Telephone Encounter (Signed)
-----   Message from Meredith PelPaula M Guenther, NP sent at 06/27/2017  1:40 PM EDT ----- Kevin Bauer, please contact patient in next couple of days to have him come in for LFTs in 2 weeks. Thanks

## 2017-07-19 ENCOUNTER — Other Ambulatory Visit: Payer: Self-pay

## 2017-07-20 LAB — COMPREHENSIVE METABOLIC PANEL
ALK PHOS: 121 IU/L — AB (ref 39–117)
ALT: 93 IU/L — AB (ref 0–44)
AST: 52 IU/L — AB (ref 0–40)
Albumin/Globulin Ratio: 1.2 (ref 1.2–2.2)
Albumin: 4.1 g/dL (ref 3.5–5.5)
BUN/Creatinine Ratio: 10 (ref 9–20)
BUN: 10 mg/dL (ref 6–20)
Bilirubin Total: 0.7 mg/dL (ref 0.0–1.2)
CALCIUM: 9.2 mg/dL (ref 8.7–10.2)
CO2: 21 mmol/L (ref 20–29)
CREATININE: 1.01 mg/dL (ref 0.76–1.27)
Chloride: 106 mmol/L (ref 96–106)
GFR calc Af Amer: 119 mL/min/{1.73_m2} (ref >59)
GFR, EST NON AFRICAN AMERICAN: 103 mL/min/{1.73_m2} (ref >59)
GLOBULIN, TOTAL: 3.3 g/dL (ref 1.5–4.5)
GLUCOSE: 141 mg/dL — AB (ref 65–99)
Potassium: 3.5 mmol/L (ref 3.5–5.2)
Sodium: 143 mmol/L (ref 134–144)
Total Protein: 7.4 g/dL (ref 6.0–8.5)

## 2018-12-26 IMAGING — DX DG ABDOMEN 1V
2 series · 2 of 2 positions shown · non-contrast
Comparison: None.

CLINICAL DATA: Generalized abdominal pain for 1 week. No bowel
movement for a few days. Right upper quadrant pain.

EXAM:
ABDOMEN - 1 VIEW

[abdomen kub (1 of 2)]
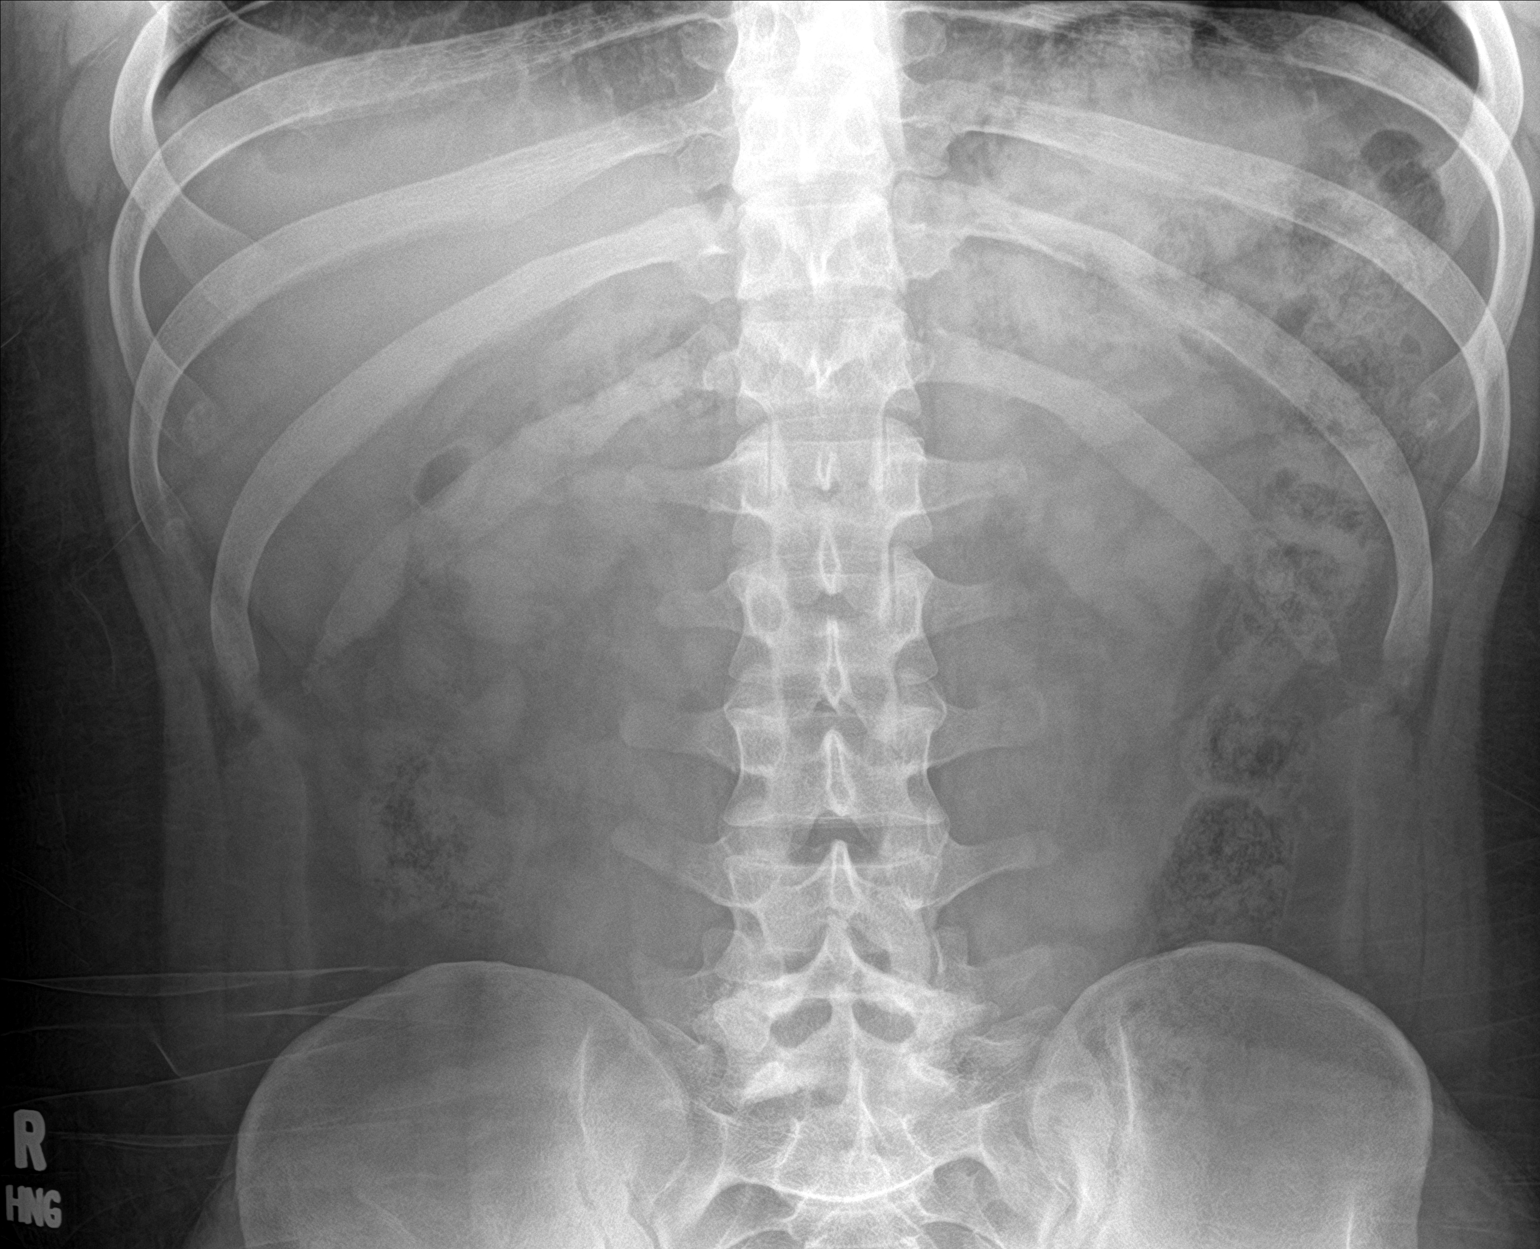

[abdomen kub (2 of 2)]
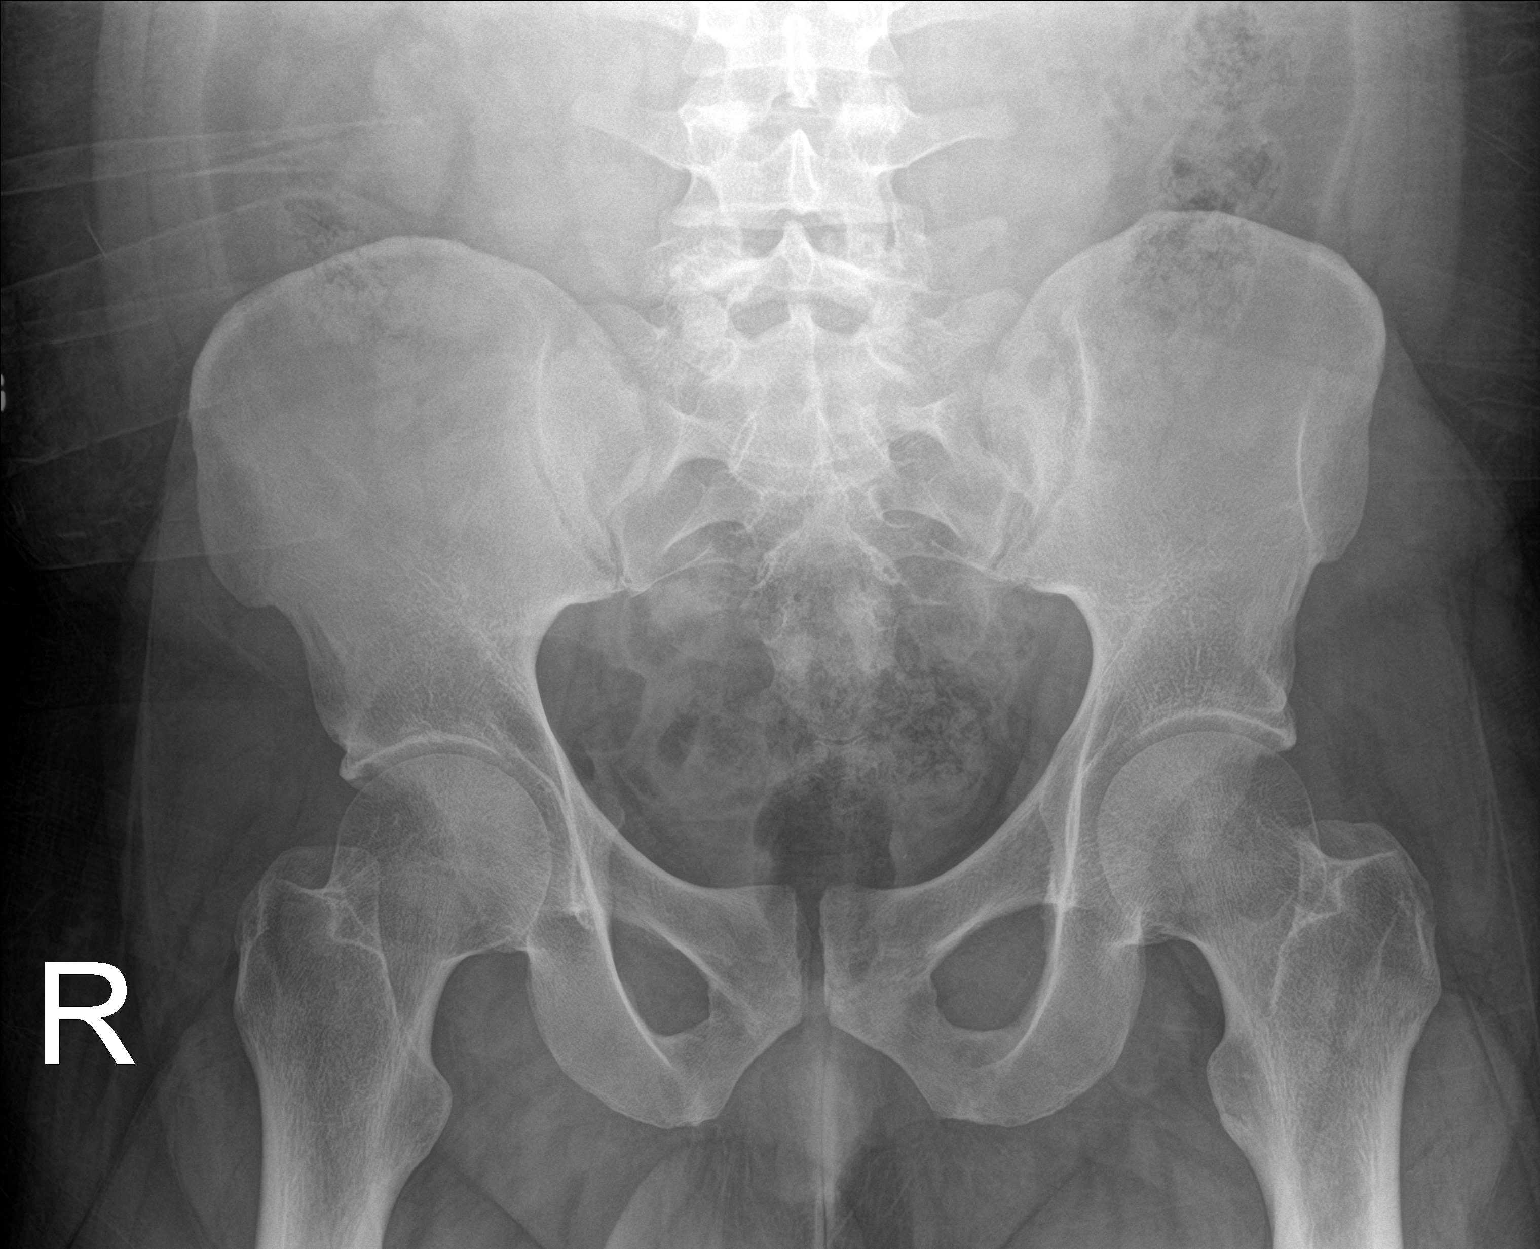

[2 of 2 positions shown; findings below may reference images not displayed]

FINDINGS: Gas and stool throughout the colon. No small or large bowel
distention. No radiopaque stones. Visualized bones appear intact.
Soft tissue contours are normal.
IMPRESSION: Normal nonobstructive bowel gas pattern with stool-filled colon.

## 2018-12-27 IMAGING — RF DG ERCP WO/W SPHINCTEROTOMY
1 series · 4 of 4 positions shown · non-contrast
Comparison: MRCP 06/23/2017

CLINICAL DATA: 27-year-old female with choledocholithiasis

EXAM:
ERCP
TECHNIQUE: Multiple spot images obtained with the fluoroscopic device and
submitted for interpretation post-procedure.
FLUOROSCOPY TIME:  Fluoroscopy Time:  2 minutes 12 seconds

[Series 1: run · 4 of 4 slices shown]
[im 1/4]
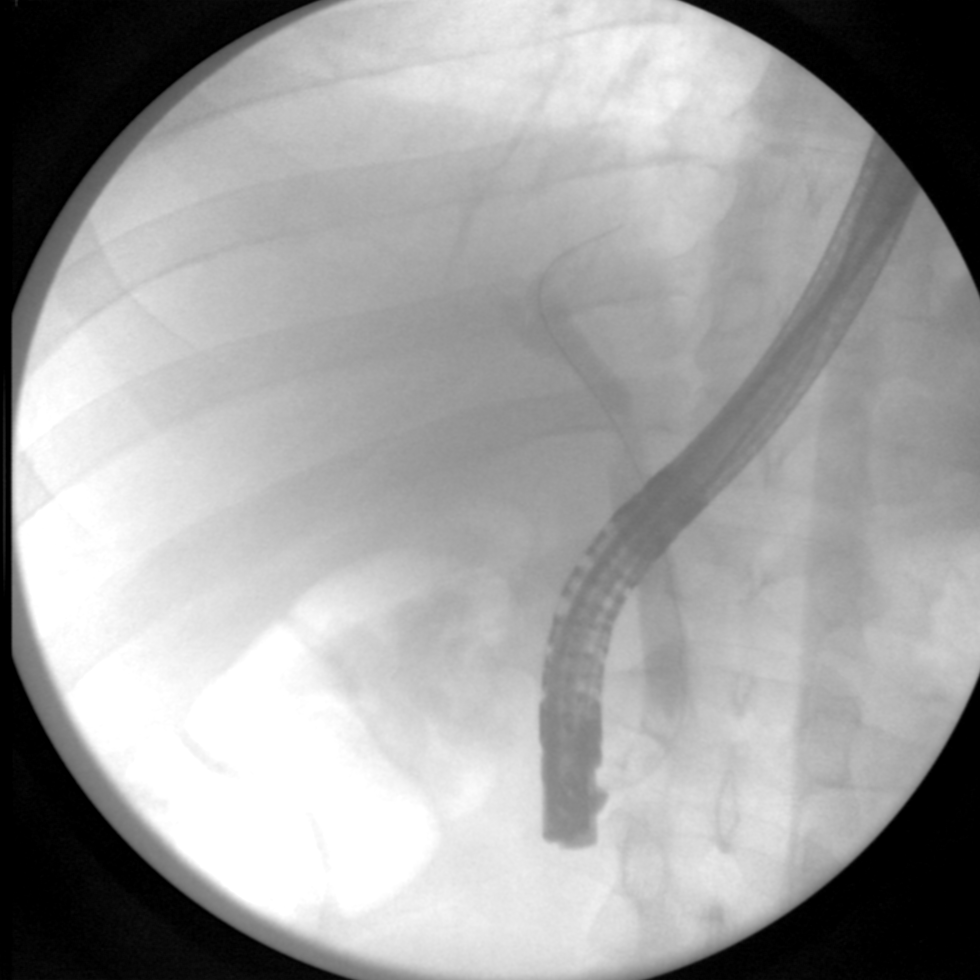
[im 2/4]
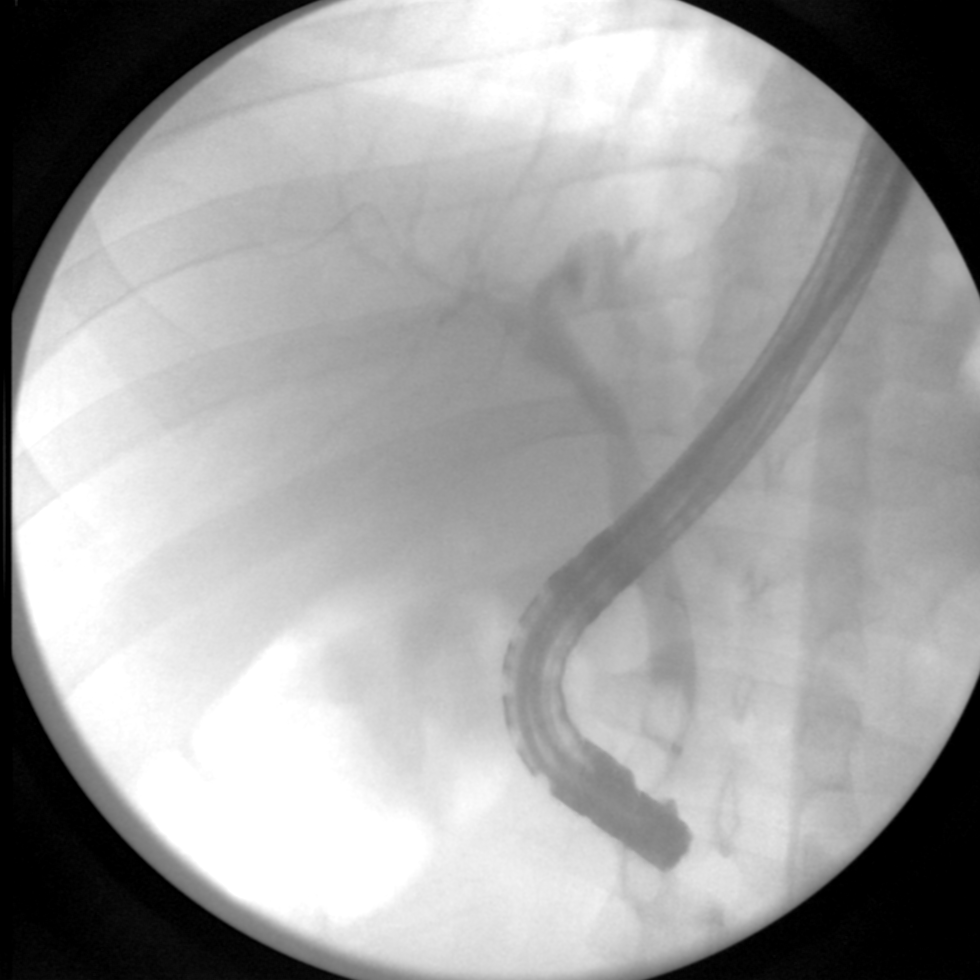
[im 3/4]
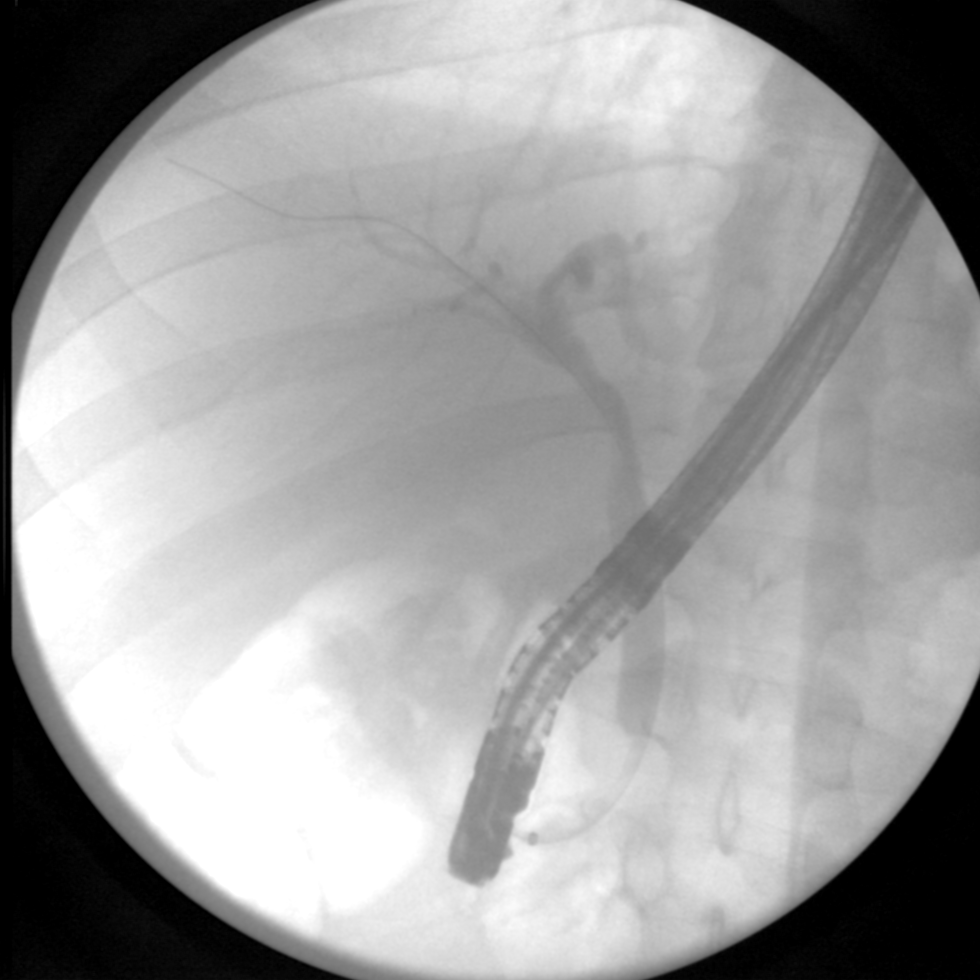
[im 4/4]
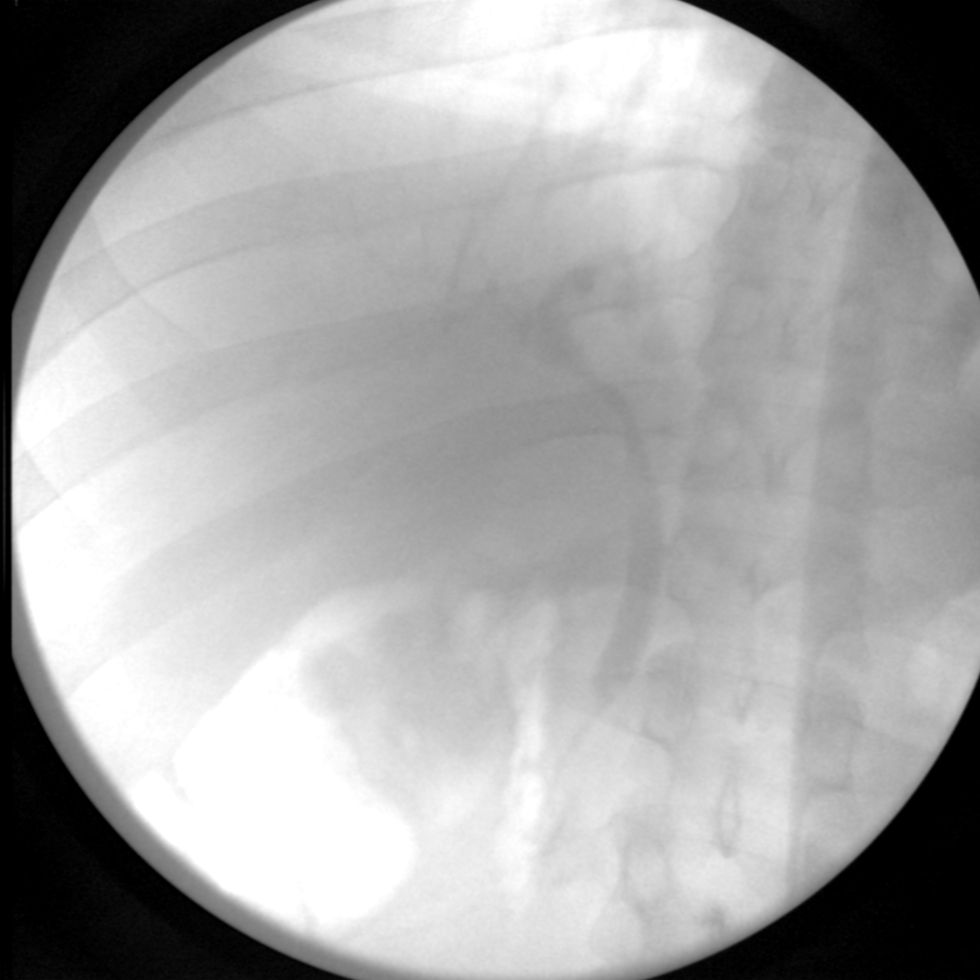

[4 of 4 positions shown; findings below may reference images not displayed]

FINDINGS: Four intraoperative saved images obtained during ERCP and submitted
for review. The images demonstrate a flexible endoscope in the
descending duodenum with wire cannulation of the common duct.
Cholangiogram demonstrates mild dilatation of the common duct. There
are 2 faceted filling defects in the distal common duct consistent
with choledocholithiasis. The subsequent images demonstrate
sphincterotomy and balloon sweep of the common duct.
IMPRESSION: 1. Choledocholithiasis.
2. ERCP with sphincterotomy and balloon sweep of the common duct.

These images were submitted for radiologic interpretation only.
Please see the procedural report for the amount of contrast and the
fluoroscopy time utilized.

## 2019-12-17 IMAGING — US US ABDOMEN LIMITED
1 series · 14 of 25 positions shown · non-contrast
Comparison: None.

CLINICAL DATA: Right upper quadrant pain and nausea

EXAM:
ULTRASOUND ABDOMEN LIMITED RIGHT UPPER QUADRANT

[Series 1: us abdomen limited · 0.30mm/px · 14 of 44 slices shown]
[im 1/44]
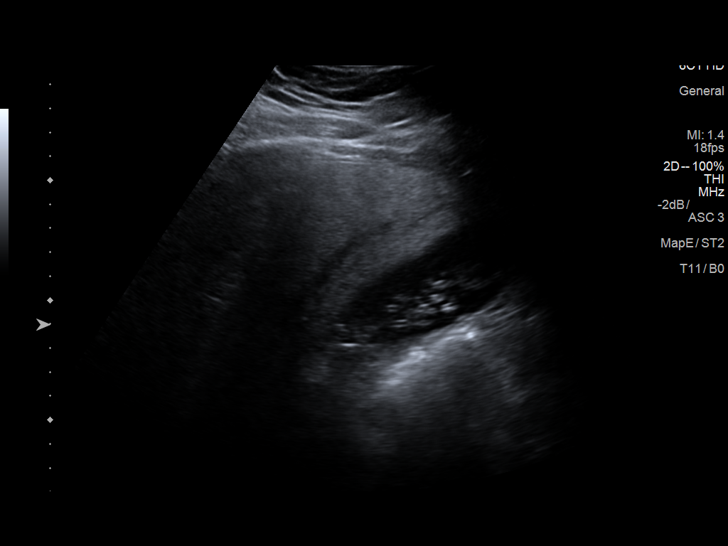
[im 4/44]
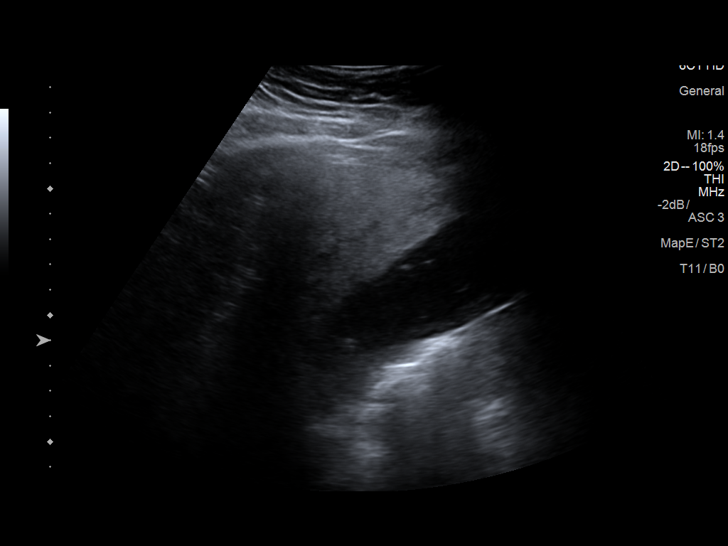
[im 8/44]
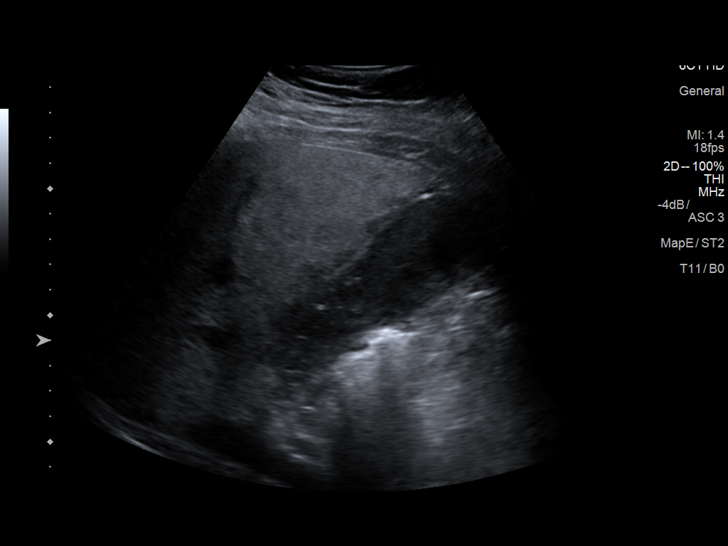
[im 11/44]
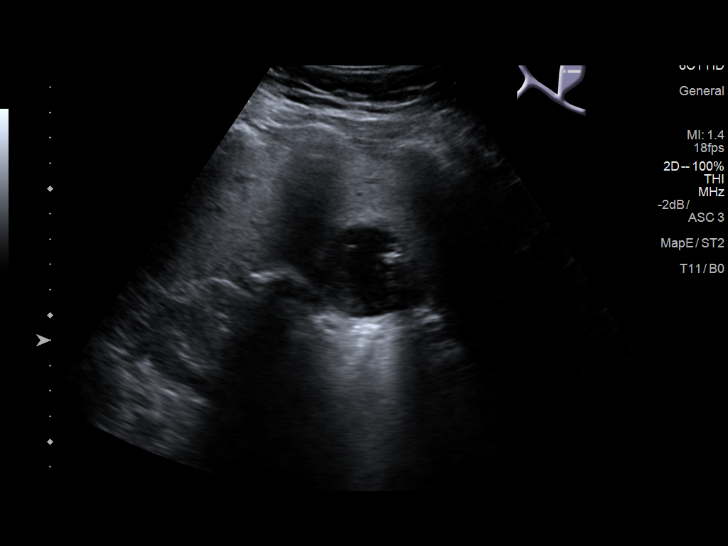
[im 15/44]
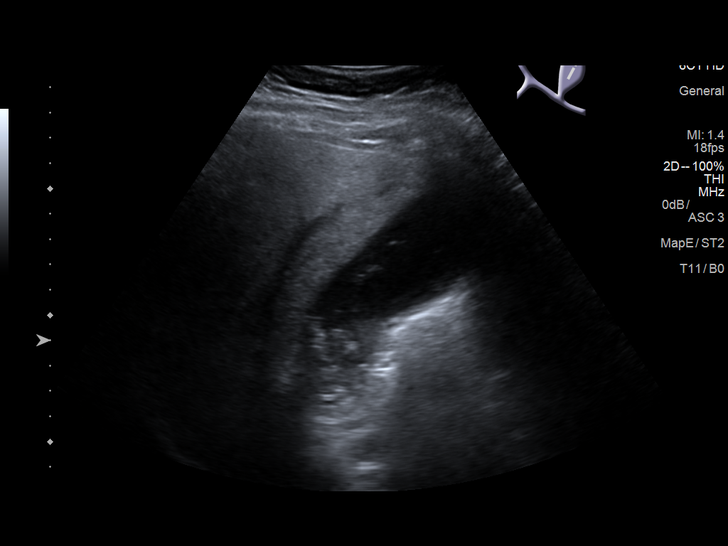
[im 17/44]
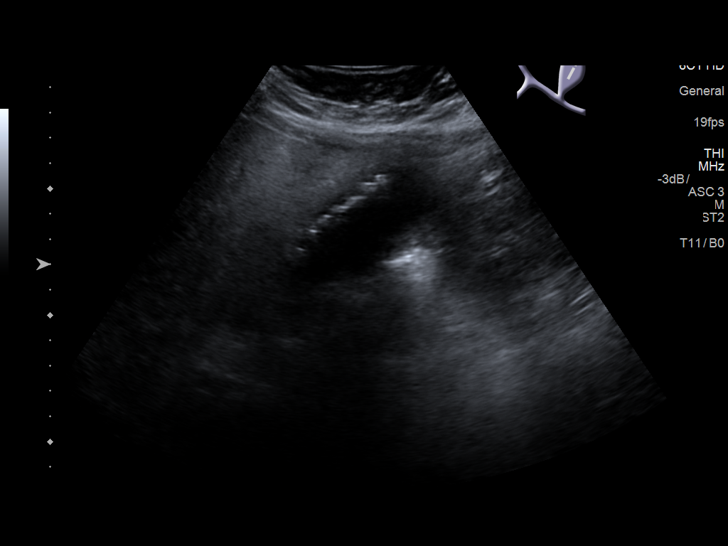
[im 20/44]
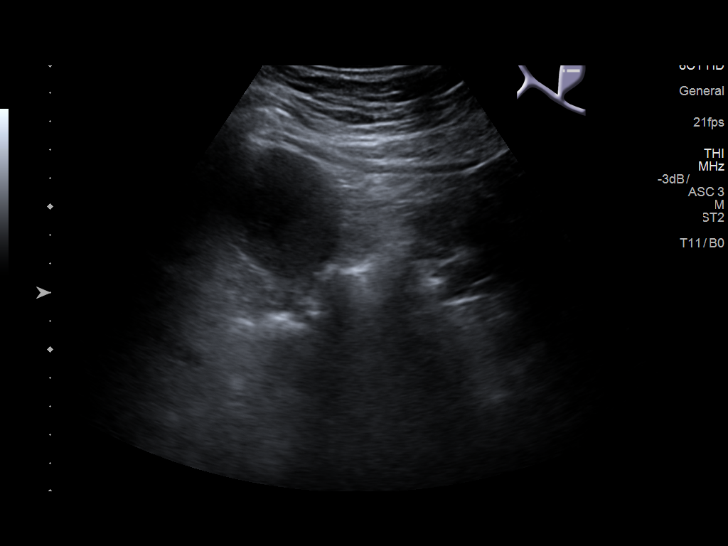
[im 24/44]
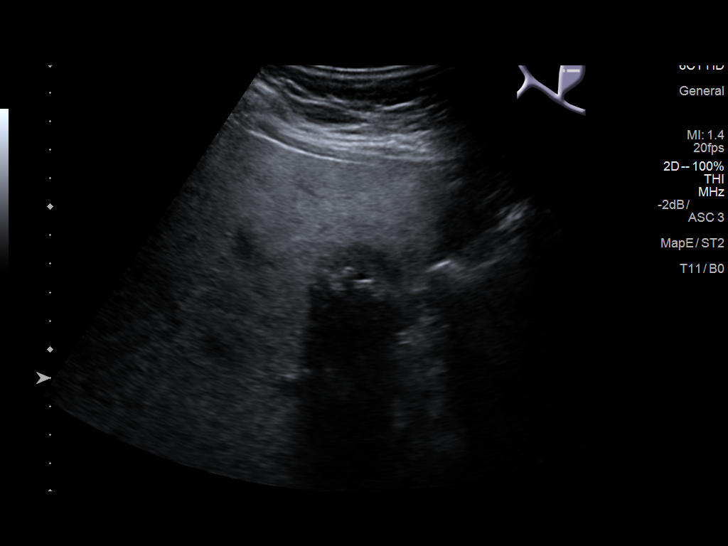
[im 27/44]
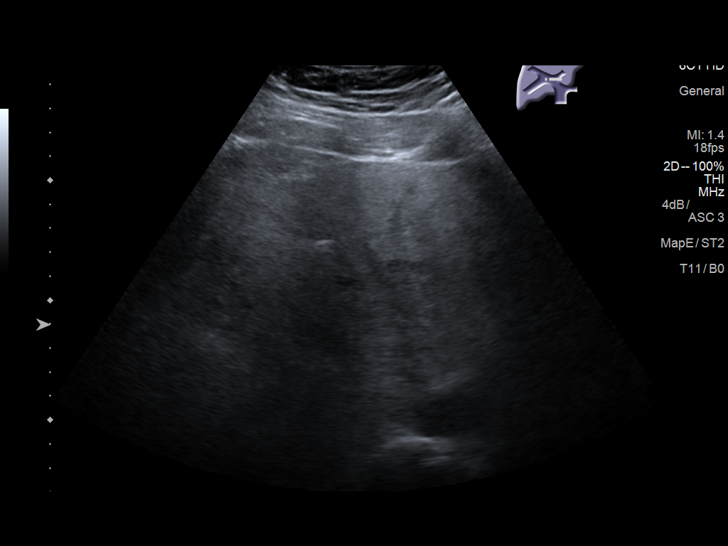
[im 29/44]
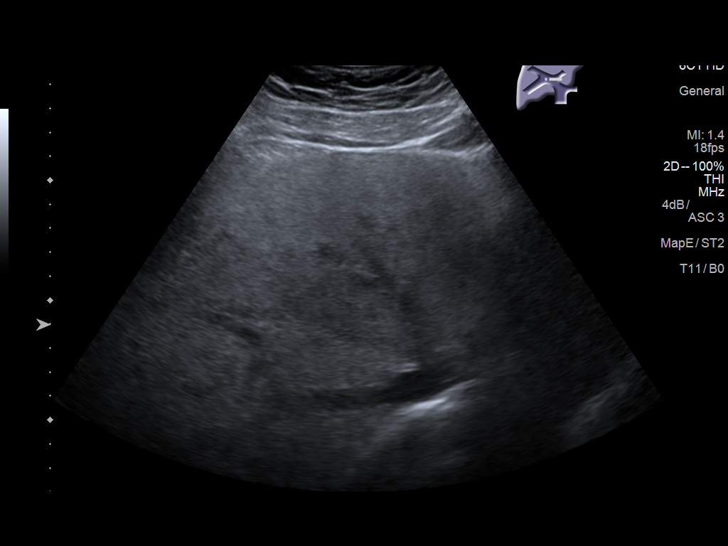
[im 33/44]
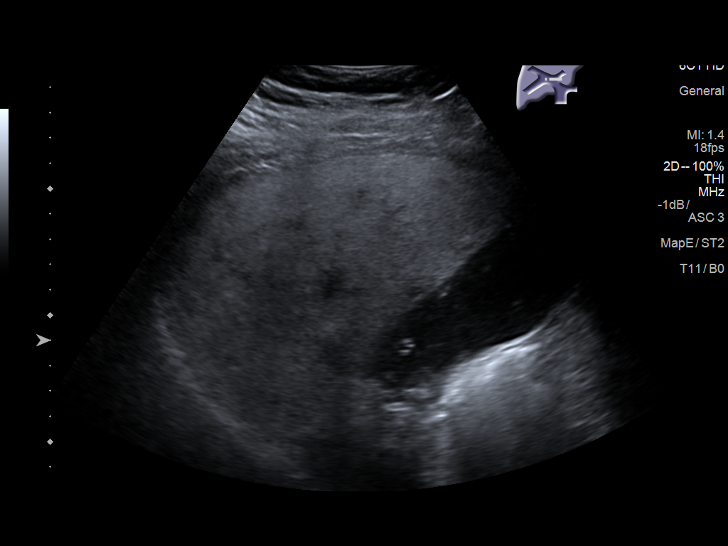
[im 36/44]
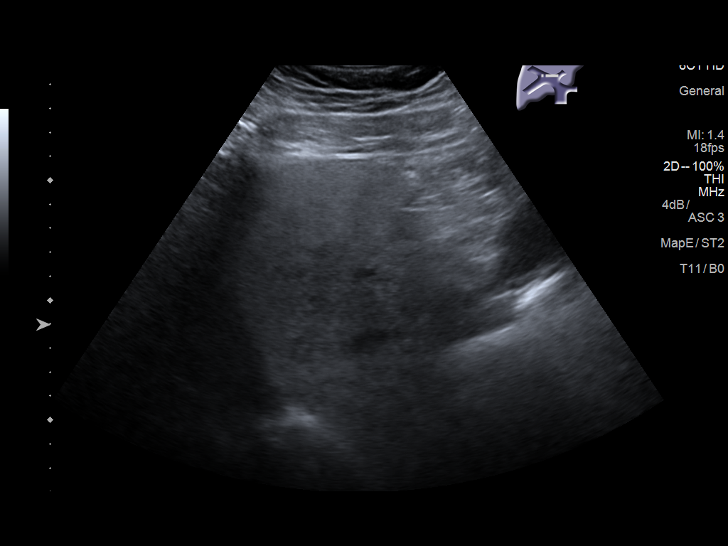
[im 40/44]
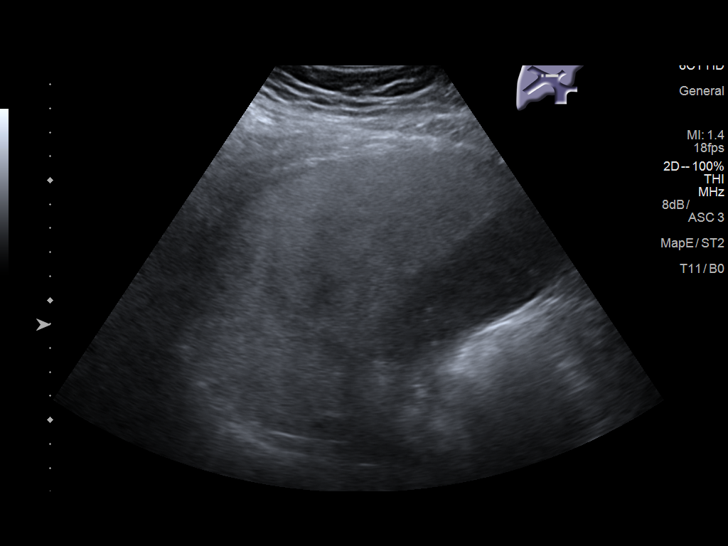
[im 44/44]
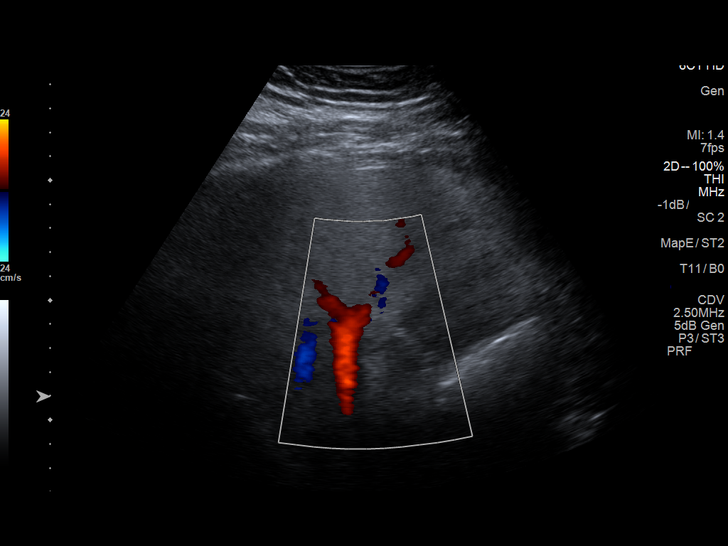

[14 of 25 positions shown; findings below may reference images not displayed]

FINDINGS: Gallbladder:

There are numerous stones within the gallbladder. No gallbladder
wall thickening nor pericholecystic fluid. There is also sludge
within the gallbladder. Negative sonographic Murphy sign.

Common bile duct:

Diameter: 5.6 mm

Liver:

Heterogeneous hepatic echotexture without discrete mass. Portal vein
is patent on color Doppler imaging with normal direction of blood
flow towards the liver.
IMPRESSION: Cholelithiasis without other evidence of acute cholecystitis.

## 2024-01-04 ENCOUNTER — Other Ambulatory Visit: Payer: Self-pay | Admitting: Family Medicine

## 2024-01-04 DIAGNOSIS — R338 Other retention of urine: Secondary | ICD-10-CM

## 2024-01-04 DIAGNOSIS — R109 Unspecified abdominal pain: Secondary | ICD-10-CM

## 2024-01-04 DIAGNOSIS — R194 Change in bowel habit: Secondary | ICD-10-CM

## 2024-01-04 DIAGNOSIS — R634 Abnormal weight loss: Secondary | ICD-10-CM

## 2024-04-02 ENCOUNTER — Other Ambulatory Visit: Payer: Self-pay

## 2024-04-02 ENCOUNTER — Emergency Department (HOSPITAL_COMMUNITY)
Admission: EM | Admit: 2024-04-02 | Discharge: 2024-04-03 | Disposition: A | Discharge status: Home / Self Care | Attending: Emergency Medicine | Admitting: Emergency Medicine

## 2024-04-02 DIAGNOSIS — R39198 Other difficulties with micturition: Secondary | ICD-10-CM | POA: Insufficient documentation

## 2024-04-02 DIAGNOSIS — K5900 Constipation, unspecified: Secondary | ICD-10-CM | POA: Diagnosis not present

## 2024-04-02 DIAGNOSIS — R109 Unspecified abdominal pain: Secondary | ICD-10-CM

## 2024-04-02 DIAGNOSIS — J45909 Unspecified asthma, uncomplicated: Secondary | ICD-10-CM | POA: Diagnosis not present

## 2024-04-02 LAB — COMPREHENSIVE METABOLIC PANEL WITH GFR
ALT: 28 U/L (ref 0–44)
AST: 30 U/L (ref 15–41)
Albumin: 4.5 g/dL (ref 3.5–5.0)
Alkaline Phosphatase: 101 U/L (ref 38–126)
Anion gap: 13 (ref 5–15)
BUN: 6 mg/dL (ref 6–20)
CO2: 24 mmol/L (ref 22–32)
Calcium: 9.8 mg/dL (ref 8.9–10.3)
Chloride: 104 mmol/L (ref 98–111)
Creatinine, Ser: 0.9 mg/dL (ref 0.61–1.24)
GFR, Estimated: >60 mL/min
Glucose, Bld: 89 mg/dL (ref 70–99)
Potassium: 3.6 mmol/L (ref 3.5–5.1)
Sodium: 142 mmol/L (ref 135–145)
Total Bilirubin: 0.5 mg/dL (ref 0.0–1.2)
Total Protein: 8.7 g/dL — ABNORMAL HIGH (ref 6.5–8.1)

## 2024-04-02 LAB — CBC
HCT: 45.4 % (ref 39.0–52.0)
Hemoglobin: 15.4 g/dL (ref 13.0–17.0)
MCH: 30.3 pg (ref 26.0–34.0)
MCHC: 33.9 g/dL (ref 30.0–36.0)
MCV: 89.2 fL (ref 80.0–100.0)
Platelets: 334 K/uL (ref 150–400)
RBC: 5.09 MIL/uL (ref 4.22–5.81)
RDW: 13.4 % (ref 11.5–15.5)
WBC: 6.4 K/uL (ref 4.0–10.5)
nRBC: 0 % (ref 0.0–0.2)

## 2024-04-02 LAB — LIPASE, BLOOD: Lipase: 35 U/L (ref 11–51)

## 2024-04-02 NOTE — ED Triage Notes (Signed)
 Pt ambulatory to triage with complaints of abdominal pain, constipation, and difficulty urinating. PT feels that he has to force out his urine which began after the constipation. PT states that he has not taken any medications.

## 2024-04-03 ENCOUNTER — Emergency Department (HOSPITAL_COMMUNITY)

## 2024-04-03 ENCOUNTER — Encounter (HOSPITAL_COMMUNITY): Payer: Self-pay

## 2024-04-03 LAB — URINALYSIS, ROUTINE W REFLEX MICROSCOPIC
Bilirubin Urine: NEGATIVE
Glucose, UA: NEGATIVE mg/dL
Hgb urine dipstick: NEGATIVE
Ketones, ur: NEGATIVE mg/dL
Leukocytes,Ua: NEGATIVE
Nitrite: NEGATIVE
Protein, ur: NEGATIVE mg/dL
pH: 6 (ref 5.0–8.0)

## 2024-04-03 MED ORDER — IOHEXOL 300 MG/ML  SOLN
100.0000 mL | Freq: Once | INTRAMUSCULAR | Status: AC | PRN
  Administered 2024-04-03: 100 mL via INTRAVENOUS

## 2024-04-03 MED ORDER — SODIUM CHLORIDE 0.9 % IV BOLUS
1000.0000 mL | Freq: Once | INTRAVENOUS | Status: AC
  Administered 2024-04-03: 1000 mL via INTRAVENOUS

## 2024-04-03 NOTE — Discharge Instructions (Addendum)
 Your evaluation did not show anything serious to cause your pain.  There is no evidence of any kind of blockage.  There is no evidence of a kidney stone.  I recommend that you take polyethylene glycol (MiraLAX) every day.  You may take bisacodyl (Dulcolax) on an occasional basis to have a bowel movement.  However, you should not use this more than once every 2 weeks.  Return to the emergency department if your symptoms are getting worse.

## 2024-04-03 NOTE — ED Notes (Signed)
 Pt stated that he can not leave us  a urine sample until he is given some water. RN notified

## 2024-04-03 NOTE — ED Provider Notes (Signed)
 " [facility] AT [facility] Provider Note   CSN: [id number] Arrival date & time: 04/02/24  1306     Patient presents with: Abdominal Pain and Constipation   Kevin Bauer is a 34 y.o. male.   The history is provided by the patient.  Abdominal Pain Associated symptoms: constipation   Constipation Associated symptoms: abdominal pain    He has history of asthma, GERD and comes in complaining of constipation and feeling like he is unable to urinate.  This has been going on for about the last 3 days.  He states he has to force himself to have a bowel movement and has to force himself to urinate.  Last bowel movement was 2 days ago, and he had to force himself to go and does not feel he evacuated Foley.  He did take a dose of polyethylene glycol 2 days ago, no other laxatives have been taken.  He denies nausea or vomiting.  He does have an intermittent pain in the left mid abdomen, but this has been going on more than a year and seems mostly related to episodes of constipation.  Of note, patient and his mother are very angry about the wait time.    Prior to Admission medications  Medication Sig Start Date End Date Taking? Authorizing Provider  Multiple Vitamins-Minerals (MULTI-VITAMIN GUMMIES) CHEW Chew 1 tablet by mouth once.    [provider]  oxyCODONE  (ROXICODONE ) 5 MG immediate release tablet Take 1 tablet (5 mg total) by mouth every 6 (six) hours as needed. 06/26/17   Tammy Sor, PA-C  pantoprazole  (PROTONIX ) 40 MG tablet Take 1 tablet (40 mg total) by mouth daily at 6 (six) AM. 06/28/17   Shona Terry SAILOR, DO    Allergies: Patient has no known allergies.    Review of Systems  Gastrointestinal:  Positive for abdominal pain and constipation.  All other systems reviewed and are negative.   Updated Vital Signs BP (!) 139/91   Pulse 75   Temp 98.5 F (36.9 C)   Resp 16   SpO2 100%   Physical Exam Vitals and nursing note reviewed.   34  year old male, resting comfortably and in no acute distress. Vital signs are significant for borderline elevated blood pressure. Oxygen saturation is 100%, which is normal. Head is normocephalic and atraumatic. PERRLA, EOMI.  Lungs are clear without rales, wheezes, or rhonchi. Heart has regular rate and rhythm without murmur. Abdomen is soft, flat, nontender.  However, palpation over the bladder makes him feel like he needs to urinate.  Bladder is not distended. Skin is warm and dry without rash. Neurologic: Mental status is normal, cranial nerves are intact, moves all extremities equally.  (all labs ordered are listed, but only abnormal results are displayed) Labs Reviewed  COMPREHENSIVE METABOLIC PANEL WITH GFR - Abnormal; Notable for the following components:      Result Value   Total Protein 8.7 (*)    All other components within normal limits  LIPASE, BLOOD  CBC  URINALYSIS, ROUTINE W REFLEX MICROSCOPIC    Radiology: CT ABDOMEN PELVIS W CONTRAST Result Date: 04/03/2024 EXAM: CT ABDOMEN AND PELVIS WITH CONTRAST 04/03/2024 01:46:00 AM TECHNIQUE: CT of the abdomen and pelvis was performed with the administration of 100 mL iohexol  (OMNIPAQUE ) 300 MG/ML solution. Multiplanar reformatted images are provided for review. Automated exposure control, iterative reconstruction, and/or weight-based adjustment of the mA/kV was utilized to reduce the radiation dose to as low as reasonably achievable. COMPARISON: None  available. CLINICAL HISTORY: Abdominal pain and constipation. FINDINGS: LOWER CHEST: No acute abnormality. LIVER: The liver is within normal limits. GALLBLADDER AND BILE DUCTS: The gallbladder has been surgically removed. No biliary ductal dilatation. SPLEEN: No acute abnormality. PANCREAS: No acute abnormality. ADRENAL GLANDS: No acute abnormality. KIDNEYS, URETERS AND BLADDER: No stones in the kidneys or ureters. No hydronephrosis. No perinephric or periureteral stranding. Urinary bladder  is unremarkable. GI AND BOWEL: Stomach demonstrates no acute abnormality. The small bowel is unremarkable. No obstructive or inflammatory changes of the colon are seen. No findings to suggest constipation are noted. The appendix is within normal limits. There is no bowel obstruction. PERITONEUM AND RETROPERITONEUM: No ascites. No free air. VASCULATURE: Aorta is normal in caliber. LYMPH NODES: No lymphadenopathy. REPRODUCTIVE ORGANS: The prostate is within normal limits. 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 No focal soft tissue abnormality. IMPRESSION: 1. No CT evidence of bowel obstruction or constipation. Electronically signed by: Oneil Devonshire MD 04/03/2024 01:56 AM EST RP Workstation: [id number]     Procedures   Medications Ordered in the ED  iohexol  (OMNIPAQUE ) 300 MG/ML solution 100 mL (100 mLs Intravenous Contrast Given 04/03/24 0140)  sodium chloride  0.9 % bolus 1,000 mL (1,000 mLs Intravenous New Bag/Given 04/03/24 0248)                                    Medical Decision Making Amount and/or Complexity of Data Reviewed Labs: ordered. Radiology: ordered.  Risk Prescription drug management.   Constipation and difficulty urinating.  Differential diagnosis includes, but is not limited to, functional constipation, IBS-C.  Doubt UTI, diverticulitis, bowel obstruction.  However, mother is very concerned that he does have an obstruction.  I have reviewed his laboratory tests, my interpretation is normal comprehensive metabolic panel, normal lipase, normal CBC.  Urinalysis had been ordered but had not been obtained.  I have informed him of the importance of obtaining a urine to evaluate for UTI.  I have ordered CT of abdomen and pelvis.  I have reviewed his past records, and note ERCP for common bile duct stone, laparoscopic cholecystectomy in April 2019.  Urinalysis is normal.  CT of abdomen and pelvis shows no acute process.  I  have independently viewed the images, and agree with the radiologist's interpretation.  I have explained these findings to the patient.  No explanation for his symptoms.  I am referring him to urology regarding his urinary symptoms, encouraging him to use polyethylene glycol on a daily basis to help with his constipation.  Return precautions discussed.     Final diagnoses:  Left sided abdominal pain  Difficulty urinating  Constipation, unspecified constipation type    ED Discharge Orders     None          Raford Lenis, MD 04/03/24 [phone]  "
